# Patient Record
Sex: Female | Born: 1975 | Race: White | Hispanic: No | Marital: Married | Smoking: Never smoker
Health system: Southern US, Community
[De-identification: ages and names within clinical notes are randomized; demographics above are authoritative.]

## PROBLEM LIST (undated history)

## (undated) DIAGNOSIS — T7840XA Allergy, unspecified, initial encounter: Secondary | ICD-10-CM

## (undated) DIAGNOSIS — Z789 Other specified health status: Secondary | ICD-10-CM

## (undated) DIAGNOSIS — B019 Varicella without complication: Secondary | ICD-10-CM

## (undated) HISTORY — DX: Varicella without complication: B01.9

## (undated) HISTORY — DX: Other specified health status: Z78.9

## (undated) HISTORY — DX: Allergy, unspecified, initial encounter: T78.40XA

## (undated) HISTORY — PX: NO PAST SURGERIES: SHX2092

---

## 2007-06-13 ENCOUNTER — Ambulatory Visit: Payer: Self-pay | Admitting: Internal Medicine

## 2007-06-13 DIAGNOSIS — J309 Allergic rhinitis, unspecified: Secondary | ICD-10-CM | POA: Insufficient documentation

## 2009-02-27 ENCOUNTER — Telehealth: Payer: Self-pay | Admitting: *Deleted

## 2009-03-14 ENCOUNTER — Ambulatory Visit: Payer: Self-pay | Admitting: Internal Medicine

## 2009-03-26 ENCOUNTER — Inpatient Hospital Stay (HOSPITAL_COMMUNITY): Admission: AD | Admit: 2009-03-26 | Discharge: 2009-03-29 | Payer: Self-pay | Admitting: Obstetrics and Gynecology

## 2010-06-18 ENCOUNTER — Encounter: Payer: Self-pay | Admitting: Internal Medicine

## 2010-06-23 ENCOUNTER — Ambulatory Visit (INDEPENDENT_AMBULATORY_CARE_PROVIDER_SITE_OTHER): Payer: Managed Care, Other (non HMO) | Admitting: Internal Medicine

## 2010-06-23 ENCOUNTER — Encounter: Payer: Self-pay | Admitting: Internal Medicine

## 2010-06-23 VITALS — BP 120/80 | HR 60 | Wt 136.0 lb

## 2010-06-23 DIAGNOSIS — J309 Allergic rhinitis, unspecified: Secondary | ICD-10-CM

## 2010-06-23 DIAGNOSIS — R040 Epistaxis: Secondary | ICD-10-CM

## 2010-06-23 MED ORDER — FLUTICASONE PROPIONATE 50 MCG/ACT NA SUSP: 2.0000 | Freq: Every day | NASAL | Status: DC

## 2010-06-23 MED ORDER — MOMETASONE FUROATE 50 MCG/ACT NA SUSP: 2.0000 | Freq: Every day | NASAL | Status: DC

## 2010-06-23 NOTE — Patient Instructions (Signed)
Try the nasonex 2 spray each nostril each day and use saline to keep moist  Allegra claritin or  Zyrtec for antihistamine \  If not controlled call for reevaluation

## 2010-06-27 NOTE — Progress Notes (Signed)
  Subjective:    Patient ID: Becky Hoover, female    DOB: May 19, 1975, 35 y.o.   MRN: [id number]  HPI Patient comes in today for follow-up for medications on her allergies.  Her last office visit was over a year ago. She has use Flonase in the past with some significant help however she does get some nosebleeds or irritation with that. She also uses over-the-counter antihistamines. There is no history of asthma or shortness of breath. Her current treatment is Claritin and Flonase. Since her last visit with that she has had no major changes in her health status. Previous electronic record reviewed and updated.   Review of Systems No chest pain shortness of breath cough wheeze has nasal congestion is itching sneezing     Objective:   Physical Exam Well developed wn in acute distress HEENT: Normocephalic ;atraumatic , Eyes;  PERRL, EOMs  Full, lids and conjunctiva clear,,Ears: no deformities, canals nl, TM landmarks normal, Nose: no deformity or discharge Moderately congested turbinates faced nontender  Mouth : OP clear without lesion or edema . Neck : No masses adenopathy Chest:  Clear to A&P without wheezes rales or rhonchi CV:  S1-S2 no gallops or murmurs peripheral perfusion is normal Skin clear  No clubbing cyanosis or edema       Assessment & Plan:  Allergic rhinitis help with nasal steroids but side effects Sample of Nasonex given to see if this works better with less irritation. Instructions on use. She can call for refills as needed and see her in a year. She gets a regular checkups from her OB/GYN otherwise.

## 2010-07-13 LAB — CBC
HCT: 35.7 % — ABNORMAL LOW (ref 36.0–46.0)
HCT: 43.5 % (ref 36.0–46.0)
Hemoglobin: 14.7 g/dL (ref 12.0–15.0)
MCV: 94.5 fL (ref 78.0–100.0)
Platelets: 138 10*3/uL — ABNORMAL LOW (ref 150–400)
Platelets: 164 10*3/uL (ref 150–400)
RBC: 4.6 MIL/uL (ref 3.87–5.11)
RDW: 13 % (ref 11.5–15.5)
WBC: 10.7 10*3/uL — ABNORMAL HIGH (ref 4.0–10.5)
WBC: 16.8 10*3/uL — ABNORMAL HIGH (ref 4.0–10.5)

## 2010-07-13 LAB — CCBB MATERNAL DONOR DRAW

## 2010-10-28 ENCOUNTER — Other Ambulatory Visit: Payer: Self-pay | Admitting: Obstetrics and Gynecology

## 2010-10-28 DIAGNOSIS — Z1231 Encounter for screening mammogram for malignant neoplasm of breast: Secondary | ICD-10-CM

## 2010-11-25 ENCOUNTER — Ambulatory Visit
Admission: RE | Admit: 2010-11-25 | Discharge: 2010-11-25 | Disposition: A | Discharge status: Home / Self Care | Payer: Managed Care, Other (non HMO) | Source: Ambulatory Visit | Attending: Obstetrics and Gynecology | Admitting: Obstetrics and Gynecology

## 2010-11-25 DIAGNOSIS — Z1231 Encounter for screening mammogram for malignant neoplasm of breast: Secondary | ICD-10-CM

## 2011-02-05 LAB — OB RESULTS CONSOLE HEPATITIS B SURFACE ANTIGEN: Hepatitis B Surface Ag: NEGATIVE

## 2011-02-05 LAB — OB RESULTS CONSOLE GC/CHLAMYDIA: Gonorrhea: NEGATIVE

## 2011-02-05 LAB — OB RESULTS CONSOLE ANTIBODY SCREEN: Antibody Screen: NEGATIVE

## 2011-02-05 LAB — OB RESULTS CONSOLE RPR: RPR: NONREACTIVE

## 2011-04-13 NOTE — L&D Delivery Note (Signed)
Delivery Note At 4:51 PM a viable female was delivered via Vaginal, Spontaneous Delivery (Presentation: Left Occiput Anterior).  APGAR: 9, 9; weight P .   Placenta status: Intact, Spontaneous.  Cord: 3 vessels with the following complications: None.   Anesthesia: Epidural  Episiotomy: None Lacerations: 2nd degree Suture Repair: 3.0 vicryl rapide Est. Blood Loss (mL): 500  Mom to postpartum.  Baby to nursery-stable.  BOVARD,Lucindia Lemley 09/01/2011, 5:37 PM  Br/O+/ IUD

## 2011-08-31 ENCOUNTER — Telehealth (HOSPITAL_COMMUNITY): Payer: Self-pay | Admitting: *Deleted

## 2011-08-31 ENCOUNTER — Encounter (HOSPITAL_COMMUNITY): Payer: Self-pay | Admitting: *Deleted

## 2011-08-31 DIAGNOSIS — Z3480 Encounter for supervision of other normal pregnancy, unspecified trimester: Secondary | ICD-10-CM

## 2011-08-31 NOTE — Telephone Encounter (Signed)
Preadmission screen  

## 2011-08-31 NOTE — H&P (Signed)
Becky Hoover is a 36 y.o. female G2P1001 @ 39+ for iol given term status and favorable cervix.  Uncomplicated PNC, except + GBBS.  +FM, no LOF, no VB, occ ctx. Maternal Medical History:  Fetal activity: Perceived fetal activity is normal.      OB History    Grav Para Term Preterm Abortions TAB SAB Ect Mult Living   3 1 1  1  1   1     G1 term SVD, 7#4, female, G2 SAB, G3 present, no abn pap, no STDs Past Medical History  Diagnosis Date  . Allergy   . Varicella   . No pertinent past medical history    Past Surgical History  Procedure Date  . No past surgeries    Family History: family history includes Alcohol abuse in her father; Breast cancer in her mother; Cancer in her maternal aunt, mother, and paternal grandmother; Heart disease in her father and paternal grandfather; and Hypertension in her maternal grandmother. Social History:  reports that she has never smoked. She has never used smokeless tobacco. She reports that she drinks alcohol. She reports that she does not use illicit drugs.married, HR Meds PNV All NKDA   Review of Systems  Constitutional: Negative.   HENT: Negative.   Eyes: Negative.   Respiratory: Negative.   Cardiovascular: Negative.   Gastrointestinal: Negative.   Genitourinary: Negative.   Musculoskeletal: Negative.   Neurological: Negative.   Psychiatric/Behavioral: Negative.       Last menstrual period 11/28/2010. Maternal Exam:  Abdomen: Fundal height is appropriate for gestation.   Estimated fetal weight is 7-8#.   Fetal presentation: vertex  Pelvis: adequate for delivery.      Physical Exam  Constitutional: She is oriented to person, place, and time. She appears well-developed and well-nourished.  HENT:  Head: Normocephalic and atraumatic.  Neck: Normal range of motion. Neck supple.  Cardiovascular: Normal rate and regular rhythm.   Respiratory: Effort normal and breath sounds normal.  GI: Soft. Bowel sounds are normal. There is no  tenderness.  Musculoskeletal: Normal range of motion.  Neurological: She is alert and oriented to person, place, and time.  Skin: Skin is warm and dry.  Psychiatric: She has a normal mood and affect. Her behavior is normal.   SVE 3/50/-2 Prenatal labs: ABO, Rh: O/Positive/-- (10/26 0000) Antibody: Negative (10/26 0000) Rubella: Immune (10/26 0000) RPR: Nonreactive (10/26 0000)  HBsAg: Negative (10/26 0000)  HIV: Non-reactive (10/26 0000)  GBS: Positive (04/29 0000)  Hgb 14.0/ Pap WNL/ Plt 208K/ GC neg/Chl neg/ First Tri and AFP WNL/ CF neg/ glucola 130  First Tri [place] cwd Nl anat, Assessment/Plan: 35yo G3P1011 at 39+ for iol given term status and favorable cervix gbbs + treat with PCN, AROM after Pitocin to induce Epidural and/or IV pain meds for pain control   BOVARD,Mart Colpitts 08/31/2011, 4:32 PM

## 2011-09-01 ENCOUNTER — Encounter (HOSPITAL_COMMUNITY): Payer: Self-pay

## 2011-09-01 ENCOUNTER — Inpatient Hospital Stay (HOSPITAL_COMMUNITY)
Admission: RE | Admit: 2011-09-01 | Discharge: 2011-09-03 | DRG: 775 | Disposition: A | Discharge status: Home / Self Care | Payer: 59 | Source: Ambulatory Visit | Attending: Obstetrics and Gynecology | Admitting: Obstetrics and Gynecology

## 2011-09-01 ENCOUNTER — Inpatient Hospital Stay (HOSPITAL_COMMUNITY): Payer: 59 | Admitting: Anesthesiology

## 2011-09-01 ENCOUNTER — Encounter (HOSPITAL_COMMUNITY): Payer: Self-pay | Admitting: Anesthesiology

## 2011-09-01 VITALS — BP 103/63 | HR 80 | Temp 98.4°F | Resp 18 | Ht 65.0 in | Wt 165.0 lb

## 2011-09-01 DIAGNOSIS — Z22330 Carrier of Group B streptococcus: Secondary | ICD-10-CM

## 2011-09-01 DIAGNOSIS — Z3480 Encounter for supervision of other normal pregnancy, unspecified trimester: Secondary | ICD-10-CM

## 2011-09-01 DIAGNOSIS — O09529 Supervision of elderly multigravida, unspecified trimester: Secondary | ICD-10-CM | POA: Diagnosis present

## 2011-09-01 DIAGNOSIS — O99892 Other specified diseases and conditions complicating childbirth: Principal | ICD-10-CM | POA: Diagnosis present

## 2011-09-01 LAB — CBC
HCT: 38 % (ref 36.0–46.0)
Hemoglobin: 12.9 g/dL (ref 12.0–15.0)
MCHC: 33.9 g/dL (ref 30.0–36.0)
RBC: 4.08 MIL/uL (ref 3.87–5.11)
WBC: 10.6 10*3/uL — ABNORMAL HIGH (ref 4.0–10.5)

## 2011-09-01 MED ORDER — LACTATED RINGERS IV SOLN
INTRAVENOUS | Status: DC
  Administered 2011-09-01 (×2): 125 mL/h via INTRAVENOUS

## 2011-09-01 MED ORDER — CITRIC ACID-SODIUM CITRATE 334-500 MG/5ML PO SOLN: 30.0000 mL | ORAL | Status: DC | PRN

## 2011-09-01 MED ORDER — OXYTOCIN 20 UNITS IN LACTATED RINGERS INFUSION - SIMPLE
1.0000 m[IU]/min | INTRAVENOUS | Status: DC
  Administered 2011-09-01: 2 m[IU]/min via INTRAVENOUS
  Filled 2011-09-01: qty 1000

## 2011-09-01 MED ORDER — PRENATAL MULTIVITAMIN CH: 1.0000 | ORAL_TABLET | Freq: Every day | ORAL | Status: DC

## 2011-09-01 MED ORDER — IBUPROFEN 600 MG PO TABS: 600.0000 mg | ORAL_TABLET | Freq: Four times a day (QID) | ORAL | Status: DC | PRN

## 2011-09-01 MED ORDER — BUTORPHANOL TARTRATE 2 MG/ML IJ SOLN: 2.0000 mg | INTRAMUSCULAR | Status: DC | PRN

## 2011-09-01 MED ORDER — FENTANYL 2.5 MCG/ML BUPIVACAINE 1/10 % EPIDURAL INFUSION (WH - ANES)
INTRAMUSCULAR | Status: DC | PRN
  Administered 2011-09-01: 14 mL/h via EPIDURAL

## 2011-09-01 MED ORDER — SENNOSIDES-DOCUSATE SODIUM 8.6-50 MG PO TABS
2.0000 | ORAL_TABLET | Freq: Every day | ORAL | Status: DC
  Administered 2011-09-01 – 2011-09-02 (×2): 2 via ORAL

## 2011-09-01 MED ORDER — OXYCODONE-ACETAMINOPHEN 5-325 MG PO TABS: 1.0000 | ORAL_TABLET | ORAL | Status: DC | PRN

## 2011-09-01 MED ORDER — BENZOCAINE-MENTHOL 20-0.5 % EX AERO
1.0000 "application " | INHALATION_SPRAY | CUTANEOUS | Status: DC | PRN
  Filled 2011-09-01: qty 56

## 2011-09-01 MED ORDER — LANOLIN HYDROUS EX OINT: TOPICAL_OINTMENT | CUTANEOUS | Status: DC | PRN

## 2011-09-01 MED ORDER — PHENYLEPHRINE 40 MCG/ML (10ML) SYRINGE FOR IV PUSH (FOR BLOOD PRESSURE SUPPORT)
80.0000 ug | PREFILLED_SYRINGE | INTRAVENOUS | Status: DC | PRN
  Filled 2011-09-01: qty 2

## 2011-09-01 MED ORDER — WITCH HAZEL-GLYCERIN EX PADS: 1.0000 "application " | MEDICATED_PAD | CUTANEOUS | Status: DC | PRN

## 2011-09-01 MED ORDER — PHENYLEPHRINE 40 MCG/ML (10ML) SYRINGE FOR IV PUSH (FOR BLOOD PRESSURE SUPPORT)
80.0000 ug | PREFILLED_SYRINGE | INTRAVENOUS | Status: DC | PRN
  Filled 2011-09-01: qty 2
  Filled 2011-09-01: qty 5

## 2011-09-01 MED ORDER — OXYTOCIN BOLUS FROM INFUSION
500.0000 mL | Freq: Once | INTRAVENOUS | Status: DC
  Filled 2011-09-01: qty 500

## 2011-09-01 MED ORDER — PENICILLIN G POTASSIUM 5000000 UNITS IJ SOLR
2.5000 10*6.[IU] | INTRAVENOUS | Status: DC
  Administered 2011-09-01 (×2): 2.5 10*6.[IU] via INTRAVENOUS
  Filled 2011-09-01 (×5): qty 2.5

## 2011-09-01 MED ORDER — ONDANSETRON HCL 4 MG/2ML IJ SOLN: 4.0000 mg | Freq: Four times a day (QID) | INTRAMUSCULAR | Status: DC | PRN

## 2011-09-01 MED ORDER — SIMETHICONE 80 MG PO CHEW: 80.0000 mg | CHEWABLE_TABLET | ORAL | Status: DC | PRN

## 2011-09-01 MED ORDER — DIPHENHYDRAMINE HCL 25 MG PO CAPS: 25.0000 mg | ORAL_CAPSULE | Freq: Four times a day (QID) | ORAL | Status: DC | PRN

## 2011-09-01 MED ORDER — EPHEDRINE 5 MG/ML INJ
10.0000 mg | INTRAVENOUS | Status: DC | PRN
  Filled 2011-09-01: qty 2

## 2011-09-01 MED ORDER — LACTATED RINGERS IV SOLN: 500.0000 mL | INTRAVENOUS | Status: DC | PRN

## 2011-09-01 MED ORDER — FLEET ENEMA 7-19 GM/118ML RE ENEM: 1.0000 | ENEMA | RECTAL | Status: DC | PRN

## 2011-09-01 MED ORDER — FLUTICASONE PROPIONATE 50 MCG/ACT NA SUSP
2.0000 | Freq: Every day | NASAL | Status: DC
  Filled 2011-09-01: qty 16

## 2011-09-01 MED ORDER — FENTANYL 2.5 MCG/ML BUPIVACAINE 1/10 % EPIDURAL INFUSION (WH - ANES)
14.0000 mL/h | INTRAMUSCULAR | Status: DC
  Filled 2011-09-01: qty 60

## 2011-09-01 MED ORDER — ACETAMINOPHEN 325 MG PO TABS: 650.0000 mg | ORAL_TABLET | ORAL | Status: DC | PRN

## 2011-09-01 MED ORDER — LIDOCAINE HCL (PF) 1 % IJ SOLN
30.0000 mL | INTRAMUSCULAR | Status: DC | PRN
  Filled 2011-09-01 (×2): qty 30

## 2011-09-01 MED ORDER — ONDANSETRON HCL 4 MG/2ML IJ SOLN: 4.0000 mg | INTRAMUSCULAR | Status: DC | PRN

## 2011-09-01 MED ORDER — IBUPROFEN 600 MG PO TABS
600.0000 mg | ORAL_TABLET | Freq: Four times a day (QID) | ORAL | Status: DC
  Administered 2011-09-01 – 2011-09-03 (×6): 600 mg via ORAL
  Filled 2011-09-01 (×6): qty 1

## 2011-09-01 MED ORDER — DIBUCAINE 1 % RE OINT: 1.0000 "application " | TOPICAL_OINTMENT | RECTAL | Status: DC | PRN

## 2011-09-01 MED ORDER — TETANUS-DIPHTH-ACELL PERTUSSIS 5-2.5-18.5 LF-MCG/0.5 IM SUSP
0.5000 mL | Freq: Once | INTRAMUSCULAR | Status: AC
  Administered 2011-09-02: 0.5 mL via INTRAMUSCULAR
  Filled 2011-09-01: qty 0.5

## 2011-09-01 MED ORDER — LACTATED RINGERS IV SOLN
500.0000 mL | Freq: Once | INTRAVENOUS | Status: AC
  Administered 2011-09-01: 500 mL via INTRAVENOUS

## 2011-09-01 MED ORDER — LACTATED RINGERS IV SOLN: INTRAVENOUS | Status: DC

## 2011-09-01 MED ORDER — ZOLPIDEM TARTRATE 5 MG PO TABS: 5.0000 mg | ORAL_TABLET | Freq: Every evening | ORAL | Status: DC | PRN

## 2011-09-01 MED ORDER — EPHEDRINE 5 MG/ML INJ
10.0000 mg | INTRAVENOUS | Status: DC | PRN
  Filled 2011-09-01: qty 4
  Filled 2011-09-01: qty 2

## 2011-09-01 MED ORDER — TERBUTALINE SULFATE 1 MG/ML IJ SOLN: 0.2500 mg | Freq: Once | INTRAMUSCULAR | Status: DC | PRN

## 2011-09-01 MED ORDER — OXYTOCIN 20 UNITS IN LACTATED RINGERS INFUSION - SIMPLE
125.0000 mL/h | Freq: Once | INTRAVENOUS | Status: AC
  Administered 2011-09-01: 125 mL/h via INTRAVENOUS

## 2011-09-01 MED ORDER — PENICILLIN G POTASSIUM 5000000 UNITS IJ SOLR
5.0000 10*6.[IU] | Freq: Once | INTRAMUSCULAR | Status: AC
  Administered 2011-09-01: 5 10*6.[IU] via INTRAVENOUS
  Filled 2011-09-01: qty 5

## 2011-09-01 MED ORDER — LIDOCAINE HCL (PF) 1 % IJ SOLN
INTRAMUSCULAR | Status: DC | PRN
  Administered 2011-09-01 (×2): 5 mL

## 2011-09-01 MED ORDER — ONDANSETRON HCL 4 MG PO TABS: 4.0000 mg | ORAL_TABLET | ORAL | Status: DC | PRN

## 2011-09-01 MED ORDER — DIPHENHYDRAMINE HCL 50 MG/ML IJ SOLN
12.5000 mg | INTRAMUSCULAR | Status: DC | PRN
Start: 2011-09-01 — End: 2011-09-01

## 2011-09-01 NOTE — Progress Notes (Signed)
Becky Hoover is a 36 y.o. G3P1011 at [redacted]w[redacted]d by LMP admitted for induction of labor due to Elective at term.  Subjective: Comfortable with epidural  Objective: BP 114/67  Pulse 66  Temp(Src) 98.4 F (36.9 C) (Oral)  Resp 20  Ht 5\' 5"  (1.651 m)  Wt 74.844 kg (165 lb)  BMI 27.46 kg/m2  LMP 11/28/2010      FHT:  FHR: 120's bpm, variability: moderate,  accelerations:  Present,  decelerations:  Absent UC:   regular, every 2 minutes SVE:   Dilation: 5 Effacement (%): 90 Station: 0 Exam by:: dr. Ellyn Hack  AROM for clear fluid w/o diff/complication.  Labs: Lab Results  Component Value Date   WBC 10.6* 09/01/2011   HGB 12.9 09/01/2011   HCT 38.0 09/01/2011   MCV 93.1 09/01/2011   PLT 174 09/01/2011    Assessment / Plan: Induction of labor due to term with favorable cervix,  progressing well on pitocin  Labor: Progressing normally Preeclampsia:  no signs or symptoms of toxicity Fetal Wellbeing:  Category I Pain Control:  Epidural I/D:  n/a Anticipated MOD:  NSVD  BOVARD,Calum Cormier 09/01/2011, 2:44 PM

## 2011-09-01 NOTE — Anesthesia Preprocedure Evaluation (Signed)

## 2011-09-01 NOTE — Progress Notes (Signed)
Patient ID: Becky Hoover, female   DOB: 09-13-1975, 36 y.o.   MRN: [id number] Pt comfortable, H&P reviewed, no changes.  D/w pt POC.  Pitocin an dPCN will AROM at lunch. FHTs 120's category I tocoq 2-57min

## 2011-09-01 NOTE — Progress Notes (Signed)
Branna Cortina is a 36 y.o. G3P1011 at [redacted]w[redacted]d admitted for induction of labor due to Elective at term.  Subjective: Getting epidural  Objective: BP 129/78  Pulse 63  Temp(Src) 98.4 F (36.9 C) (Oral)  Resp 20  Ht 5\' 5"  (1.651 m)  Wt 74.844 kg (165 lb)  BMI 27.46 kg/m2  LMP 11/28/2010      FHT:  FHR: 120 bpm, variability: moderate,  accelerations:  Present,  decelerations:  Absent UC:   regular, every 2-4 minutes SVE:   Dilation: 4 Effacement (%): 70 Station: -1 Exam by:: dr. Ellyn Hack  Labs: Lab Results  Component Value Date   WBC 10.6* 09/01/2011   HGB 12.9 09/01/2011   HCT 38.0 09/01/2011   MCV 93.1 09/01/2011   PLT 174 09/01/2011    Assessment / Plan: Induction of labor due to term with favorable cervix,  progressing well on pitocin  Labor: Progressing normally Preeclampsia:  no signs or symptoms of toxicity Fetal Wellbeing:  Category I Pain Control:  Epidural I/D:  n/a Anticipated MOD:  NSVD  AROM after epidural placement  BOVARD,Marlita Keil 09/01/2011, 12:57 PM

## 2011-09-01 NOTE — Anesthesia Procedure Notes (Signed)
Epidural Patient location during procedure: OB Start time: 09/01/2011 1:20 PM  Staffing Anesthesiologist: Brayton Caves R Performed by: anesthesiologist   Preanesthetic Checklist Completed: patient identified, site marked, surgical consent, pre-op evaluation, timeout performed, IV checked, risks and benefits discussed and monitors and equipment checked  Epidural Patient position: sitting Prep: site prepped and draped and DuraPrep Patient monitoring: continuous pulse ox and blood pressure Approach: midline Injection technique: LOR air and LOR saline  Needle:  Needle type: Tuohy  Needle gauge: 17 G Needle length: 9 cm Needle insertion depth: 5 cm cm Catheter type: closed end flexible Catheter size: 19 Gauge Catheter at skin depth: 10 cm Test dose: negative  Assessment Events: blood not aspirated, injection not painful, no injection resistance, negative IV test and no paresthesia  Additional Notes Patient identified.  Risk benefits discussed including failed block, incomplete pain control, headache, nerve damage, paralysis, blood pressure changes, nausea, vomiting, reactions to medication both toxic or allergic, and postpartum back pain.  Patient expressed understanding and wished to proceed.  All questions were answered.  Sterile technique used throughout procedure and epidural site dressed with sterile barrier dressing. No paresthesia or other complications noted.The patient did not experience any signs of intravascular injection such as tinnitus or metallic taste in mouth nor signs of intrathecal spread such as rapid motor block. Please see nursing notes for vital signs.

## 2011-09-02 ENCOUNTER — Encounter (HOSPITAL_COMMUNITY): Payer: Self-pay

## 2011-09-02 LAB — CBC
Hemoglobin: 11.8 g/dL — ABNORMAL LOW (ref 12.0–15.0)
MCH: 30.8 pg (ref 26.0–34.0)
MCV: 93.5 fL (ref 78.0–100.0)
Platelets: 156 10*3/uL (ref 150–400)
RBC: 3.83 MIL/uL — ABNORMAL LOW (ref 3.87–5.11)

## 2011-09-02 NOTE — Progress Notes (Signed)
Post Partum Day 1 Subjective: no complaints, up ad lib, tolerating PO and nl lochia, pain controlled.  Some cramping  Objective: Blood pressure 106/57, pulse 71, temperature 98.5 F (36.9 C), temperature source Oral, resp. rate 18, height 5\' 5"  (1.651 m), weight 74.844 kg (165 lb), last menstrual period 11/28/2010, SpO2 99.00%, unknown if currently breastfeeding.  Physical Exam:  General: alert and no distress Lochia: appropriate Uterine Fundus: firm   Basename 09/02/11 0525 09/01/11 0750  HGB 11.8* 12.9  HCT 35.8* 38.0    Assessment/Plan: Plan for discharge tomorrow, Breastfeeding and Lactation consult  routine care   LOS: 1 day   BOVARD,Bethanny Toelle 09/02/2011, 7:56 AM

## 2011-09-03 MED ORDER — IBUPROFEN 800 MG PO TABS: 800.0000 mg | ORAL_TABLET | Freq: Three times a day (TID) | ORAL | Status: AC | PRN

## 2011-09-03 MED ORDER — PRENATAL MULTIVITAMIN CH: 1.0000 | ORAL_TABLET | Freq: Every day | ORAL | Status: DC

## 2011-09-03 MED ORDER — OXYCODONE-ACETAMINOPHEN 5-325 MG PO TABS: 1.0000 | ORAL_TABLET | Freq: Four times a day (QID) | ORAL | Status: AC | PRN

## 2011-09-03 NOTE — Progress Notes (Signed)
Post Partum Day 2 Subjective: no complaints, up ad lib, tolerating PO and nl lochia, pain controlled  Objective: Blood pressure 103/63, pulse 80, temperature 98.4 F (36.9 C), temperature source Oral, resp. rate 18, height 5\' 5"  (1.651 m), weight 74.844 kg (165 lb), last menstrual period 11/28/2010, SpO2 99.00%, unknown if currently breastfeeding.  Physical Exam:  General: alert and no distress Lochia: appropriate Uterine Fundus: firm   Basename 09/02/11 0525 09/01/11 0750  HGB 11.8* 12.9  HCT 35.8* 38.0    Assessment/Plan: Discharge home and Breastfeedingd/c with motrin/percocet/pnv.  F/u 6 weeks   LOS: 2 days   BOVARD,Jarred Purtee 09/03/2011, 8:16 AM

## 2011-09-03 NOTE — Discharge Summary (Signed)
Obstetric Discharge Summary Reason for Admission: induction of labor Prenatal Procedures: none Intrapartum Procedures: spontaneous vaginal delivery Postpartum Procedures: none Complications-Operative and Postpartum: 2nd degree perineal laceration Hemoglobin  Date Value Range Status  09/02/2011 11.8* 12.0-15.0 (g/dL) Final     HCT  Date Value Range Status  09/02/2011 35.8* 36.0-46.0 (%) Final    Physical Exam:  General: alert and no distress Lochia: appropriate Uterine Fundus: firm   Discharge Diagnoses: Term Pregnancy-delivered  Discharge Information: Date: 09/03/2011 Activity: pelvic rest Diet: routine Medications: PNV, Ibuprofen and Percocet Condition: stable Instructions: refer to practice specific booklet Discharge to: home Follow-up Information    Follow up with BOVARD,Amalya Salmons, MD. Schedule an appointment as soon as possible for a visit in 6 weeks.   Contact information:   [address] [address] [place] [address] [phone]          Newborn Data: Live born female  Birth Weight: 7 lb 3.2 oz (3265 g) APGAR: 9, 9  Home with mother.  BOVARD,Michaela Shankel 09/03/2011, 9:02 AM

## 2011-09-03 NOTE — Anesthesia Postprocedure Evaluation (Signed)
Anesthesia Post Note  Patient: Musician  Procedure(s) Performed: * No procedures listed *  Anesthesia type: Epidural  Patient location: Mother/Baby  Post pain: Pain level controlled  Post assessment: Post-op Vital signs reviewed  Last Vitals: There were no vitals filed for this visit.  Post vital signs: Reviewed  Level of consciousness: awake  Complications: No apparent anesthesia complications

## 2011-09-04 ENCOUNTER — Inpatient Hospital Stay (HOSPITAL_COMMUNITY): Admission: AD | Admit: 2011-09-04 | Payer: Self-pay | Source: Ambulatory Visit | Admitting: Obstetrics and Gynecology

## 2012-01-04 ENCOUNTER — Telehealth: Payer: Self-pay | Admitting: *Deleted

## 2012-01-04 NOTE — Telephone Encounter (Signed)
Confirmed 02/24/12 genetic appt w/ pt.  Called Brandi at referring to make aware.  Took paperwork to Clydie Braun.

## 2012-02-24 ENCOUNTER — Ambulatory Visit (HOSPITAL_BASED_OUTPATIENT_CLINIC_OR_DEPARTMENT_OTHER): Payer: 59 | Admitting: Genetic Counselor

## 2012-02-24 ENCOUNTER — Other Ambulatory Visit: Payer: 59 | Admitting: Lab

## 2012-02-24 ENCOUNTER — Encounter: Payer: Self-pay | Admitting: Genetic Counselor

## 2012-02-24 DIAGNOSIS — Z803 Family history of malignant neoplasm of breast: Secondary | ICD-10-CM

## 2012-02-24 DIAGNOSIS — IMO0002 Reserved for concepts with insufficient information to code with codable children: Secondary | ICD-10-CM

## 2012-02-24 NOTE — Progress Notes (Signed)
Dr. Sherron Monday Stuckert requested a consultation for genetic counseling and risk assessment for Becky Hoover, a 36 y.o. female, for discussion of her family history of breast and prostate cancer. She presents to clinic today to discuss the possibility of a genetic predisposition to cancer, and to further clarify her risks, as well as her family members' risks for cancer.   HISTORY OF PRESENT ILLNESS: Becky Hoover is a 36 y.o. female with no personal history of cancer.    Past Medical History  Diagnosis Date  . Allergy   . Varicella   . No pertinent past medical history   . SVD (spontaneous vaginal delivery) 09/01/2011    Past Surgical History  Procedure Date  . No past surgeries     History  Substance Use Topics  . Smoking status: Never Smoker   . Smokeless tobacco: Never Used  . Alcohol Use: 1.2 oz/week    2 Glasses of wine per week     Comment: 1-2 per week    REPRODUCTIVE HISTORY AND PERSONAL RISK ASSESSMENT FACTORS: Menarche was at age 36.   Premenopausal Uterus Intact: Yes Ovaries Intact: Yes G3P2A1 , first live birth at age 38  She has not previously undergone treatment for infertility.   OCP use for 7 years   She has not used HRT in the past.    FAMILY HISTORY:  We obtained a detailed, 4-generation family history.  Significant diagnoses are listed below: Family History  Problem Relation Age of Onset  . Breast cancer Mother 32  . Alcohol abuse Father   . Heart disease Father   . Breast cancer Maternal Aunt 37    possibly triple negative  . Hypertension Maternal Grandmother   . Lung cancer Paternal Grandmother   . Heart disease Paternal Grandfather   . Prostate cancer Maternal Uncle 59  . Breast cancer Other     MGM's two sisters  . Breast cancer Other     MGM's mother (great grandmother)  The patient's mother was diagnosed with breast cancer at age 23.  She has a brother and sister, the brother had prostate cancer at age 61- or 9, and her sister  was diagnosed with possibly triple negative breast cancer at age 76.  The patient's maternal grandmother is alive at 70 and had never had cancer.  The patients grandmother has three sisters and a brother.  Two of her sisters were diagnosed with breast cancer, one at age 85 and the other at an unknown age.  The patient's great grandmother was also diagnsoed with breast cancer over the age of 53.  The patient's paternal grandmother was a non-smoker, and was diagnosed with lung cancer.  Patient's maternal ancestors are of [place] descent, and paternal ancestors are of [place] descent. There is no reported Ashkenazi Jewish ancestry. There is no  known consanguinity.  GENETIC COUNSELING RISK ASSESSMENT, DISCUSSION, AND SUGGESTED FOLLOW UP: We reviewed the natural history and genetic etiology of sporadic, familial and hereditary cancer syndromes.  About 5-10% of breast cancer is hereditary.  Of this, about 85% is the result of a BRCA1 or BRCA2 mutation.  We reviewed the red flags of hereditary cancer syndromes and the dominant inheritance patterns.  If the BRCA testing is negative, we discussed that we could be testing for the wrong gene.  We discussed gene panels, and that several cancer genes that are associated with different cancers can be tested at the same time.  We reviewed that individuals with cancer are the most  informative individuals to test as they are the ones most likely to have had the consequence of having a gene mutation.  The patient seemed to understand and will talk with her family.  IF they are unable or unwilling to pursue testing she will call back to schedule a blood draw.  The patient is eligible for having screening through the high risk clinic and possibly having breast MRI's if her insurance approves.  The patient's family history of breast and prostate cancer is suggestive of the following possible diagnosis: hereditary cancer syndrome  We discussed that identification of a  hereditary cancer syndrome may help her care providers tailor the patients medical management. If a mutation indicating a hereditary cancer syndrome is detected in this case, the [employer] recommendations would include increased cancer surveillance and possible prophylactic surgery. If a mutation is detected, the patient will be referred back to the referring provider and to any additional appropriate care providers to discuss the relevant options.   If a mutation is not found in the patient, cancer surveillance options would be discussed for the patient according to the appropriate standard National Comprehensive Cancer Network and American Cancer Society guidelines, with consideration of their personal and family history risk factors. In this case, the patient will be referred back to their care providers for discussions of management.   In order to estimate her chance of having a BRCA mutation, we used statistical models (Penn II and tyrer Cusik) and laboratory data that take into account her personal medical history, family history and ancestry.  Because each model is different, there can be a lot of variability in the risks they give.  Therefore, these numbers must be considered a rough range and not a precise risk of having a BRCA mutation.  These models estimate that she has approximately a 3.5-8% chance of having a mutation. Based on this assessment of her family and personal history, genetic testing is recommended.  Based on the patient's personal and family history, statistical models (tyrer cusik)  and literature data were used to estimate her risk of developing breast cancer. This estimates her lifetime risk of developing breast cancer to be approximately 35%. This estimation does not take into account any genetic testing results.   After considering the risks, benefits, and limitations, the patient decided to talk with her family and will call back if she wants to  pursue genetic testing.   Per the patient's request, we will contact her by telephone to discuss these results. A follow up genetic counseling visit will be scheduled if indicated.  The patient was seen for a total of 60 minutes, greater than 50% of which was spent face-to-face counseling.  This plan is being carried out per Dr. Jeanella Flattery recommendations.  This note will also be sent to the referring provider via the electronic medical record. The patient will be supplied with a summary of this genetic counseling discussion as well as educational information on the discussed hereditary cancer syndromes following the conclusion of their visit.   Patient was discussed with Dr. Drue Second.   _______________________________________________________________________ For Office Staff:  Number of people involved in session: 2 Was an Intern/ student involved with case: no

## 2012-03-13 ENCOUNTER — Encounter: Payer: Self-pay | Admitting: Genetic Counselor

## 2012-06-29 ENCOUNTER — Encounter: Payer: Self-pay | Admitting: Internal Medicine

## 2012-06-29 ENCOUNTER — Ambulatory Visit (INDEPENDENT_AMBULATORY_CARE_PROVIDER_SITE_OTHER): Payer: 59 | Admitting: Internal Medicine

## 2012-06-29 VITALS — BP 104/80 | HR 86 | Temp 98.8°F | Wt 127.0 lb

## 2012-06-29 DIAGNOSIS — L259 Unspecified contact dermatitis, unspecified cause: Secondary | ICD-10-CM

## 2012-06-29 MED ORDER — CEPHALEXIN 500 MG PO CAPS: 500.0000 mg | ORAL_CAPSULE | Freq: Two times a day (BID) | ORAL | Status: DC

## 2012-06-29 MED ORDER — NYSTATIN 100000 UNIT/GM EX POWD: Freq: Two times a day (BID) | CUTANEOUS | Status: DC

## 2012-06-29 MED ORDER — FLUCONAZOLE 150 MG PO TABS: 150.0000 mg | ORAL_TABLET | Freq: Once | ORAL | Status: DC

## 2012-06-29 NOTE — Progress Notes (Signed)
Chief Complaint  Patient presents with  . Rash    Has a rash on her buttocks.  First noticed it around Thanksgiving.  Used diaper rash cream and a rx prescribed by her gynecologist.    HPI: Patient comes in today for SDA for  new problem evaluation.  Ever since the fall or the birth of her child she's had some problem with irritation ranch in the area between her gluteal folds. However just gotten much worse and now it is sore and painful. Itches at times. She called her OB/GYN and they gave her a pill to take one time this was in February. It may have helped a little bit but if persistent is compact comes in today for evaluation. Has usedYeast infection cream  .   Monistat  Cream for 4 days .  May be helping a little.  No history of this problem ever before no vaginal problems unusual rashes. No fever abdominal pain  ROS: See pertinent positives and negatives per HPI.  Past Medical History  Diagnosis Date  . Allergy   . Varicella   . No pertinent past medical history   . SVD (spontaneous vaginal delivery) 09/01/2011    Family History  Problem Relation Age of Onset  . Breast cancer Mother 76  . Alcohol abuse Father   . Heart disease Father   . Breast cancer Maternal Aunt 37    possibly triple negative  . Hypertension Maternal Grandmother   . Lung cancer Paternal Grandmother   . Heart disease Paternal Grandfather   . Prostate cancer Maternal Uncle 59  . Breast cancer Other     MGM's two sisters  . Breast cancer Other     MGM's mother (great grandmother)    History   Social History  . Marital Status: Married    Spouse Name: N/A    Number of Children: N/A  . Years of Education: N/A   Social History Main Topics  . Smoking status: Never Smoker   . Smokeless tobacco: Never Used  . Alcohol Use: 1.2 oz/week    2 Glasses of wine per week     Comment: 1-2 per week  . Drug Use: No  . Sexually Active: Yes   Other Topics Concern  . None   Social History Narrative   Lung  Cancer PGM   Heart Disease PGF CAD at 77   Married   HH 2   Husband    No pets   Masters degree had worked in [employer]    Outpatient Encounter Prescriptions as of 06/29/2012  Medication Sig Dispense Refill  . cephALEXin (KEFLEX) 500 MG capsule Take 1 capsule (500 mg total) by mouth 2 (two) times daily.  14 capsule  0  . fluconazole (DIFLUCAN) 150 MG tablet Take 1 tablet (150 mg total) by mouth once. Repeat in 3 days  2 tablet  0  . nystatin (MYCOSTATIN) powder Apply topically 2 (two) times daily. To affected area  30 g  1  . [DISCONTINUED] Prenatal Vit-Fe Fumarate-FA (PRENATAL MULTIVITAMIN) TABS Take 1 tablet by mouth at bedtime.      . [DISCONTINUED] Prenatal Vit-Fe Fumarate-FA (PRENATAL MULTIVITAMIN) TABS Take 1 tablet by mouth at bedtime.  30 tablet  12   No facility-administered encounter medications on file as of 06/29/2012.    EXAM:  BP 104/80  Pulse 86  Temp(Src) 98.8 F (37.1 C) (Oral)  Wt 127 lb (57.607 kg)  BMI 21.13 kg/m2  SpO2 99%  Body mass index is  21.13 kg/(m^2).  GENERAL: vitals reviewed and listed above, alert, oriented, appears well hydrated and in no acute distress  HEENT: atraumatic, conjunctiva  clear, no obvious abnormalities on inspection of external nose and ears OP : no lesion edema or exudate   Abdomen soft without organomegaly. Skin very large area in the intragluteal folds over the perirectal perineum area with what looks like a satellite patch on the left side. This area is red to brown seems to have discrete edge some fissuring noted and tenderness but I'm not a lot of edema. There is no discharge. No scaling is noted PSYCH: pleasant and cooperative, no obvious depression or anxiety  ASSESSMENT AND PLAN:  Discussed the following assessment and plan:  Perianal  perineal dermatitis - with possible secondary infection   Localized bacterial skin infection And also significant area with fissuring possibly secondary bacterial infection primarily appears  to be intertriginous and possibly monilial. We'll treat aggressively for both;   keep dry expectant management to be improved in the next 7-14 days if not contact health care team. -Patient advised to return or notify health care team  if symptoms worsen or persist or new concerns arise.  Patient Instructions  This looks like a severe yeast-type infection with possibly bacteria involved.  Use the nystatin antifungal powder a few times a day  At the antibiotic and antifungal.  Gentle cleaning with something like burrows solution may be soothing.   Neta Mends. Ashaki Frosch M.D.

## 2012-06-29 NOTE — Patient Instructions (Signed)
This looks like a severe yeast-type infection with possibly bacteria involved.  Use the nystatin antifungal powder a few times a day  At the antibiotic and antifungal.  Gentle cleaning with something like burrows solution may be soothing.

## 2012-07-17 ENCOUNTER — Other Ambulatory Visit: Payer: Self-pay | Admitting: Internal Medicine

## 2012-09-14 ENCOUNTER — Telehealth: Payer: Self-pay | Admitting: Internal Medicine

## 2012-09-14 NOTE — Telephone Encounter (Signed)
Patient Information:  Caller Name: Thi  Phone: [phone]  Patient: Becky Hoover  Gender: Female  DOB: 1975/09/29  Age: 37 Years  PCP: Berniece Andreas (Family Practice)  Pregnant: No  Office Follow Up:  Does the office need to follow up with this patient?: No  Instructions For The Office: N/A   Symptoms  Reason For Call & Symptoms: Pt seenon 06/29/12 for perineal rash/dermatitis and treated.  The rash has improved but not resolved.   Skin is scaly and red.  Pt has intermittent itchiness and tenderness.  Pt using OTC Lotrimin Spray.     Reviewed Health History In EMR: Yes  Reviewed Medications In EMR: Yes  Reviewed Allergies In EMR: Yes  Reviewed Surgeries / Procedures: Yes  Date of Onset of Symptoms: 03/09/2012 OB / GYN:  LMP: 08/29/2012  Guideline(s) Used:  Rash or Redness - Localized  Jock Itch  Disposition Per Guideline:   See Today or Tomorrow in Office  Reason For Disposition Reached:   After 3 weeks on treatment and rash has not completely gone away  Advice Given:  Call Back If:   You become worse.  Expected Course:  The rash should clear up completely in 2-3 weeks.  Call Back If:   You become worse.  Patient Will Follow Care Advice:  YES  Appointment Scheduled:  09/15/2012 10:45:00 Appointment Scheduled Provider:  Kriste Basque [facility])

## 2012-09-15 ENCOUNTER — Telehealth: Payer: Self-pay

## 2012-09-15 ENCOUNTER — Ambulatory Visit (INDEPENDENT_AMBULATORY_CARE_PROVIDER_SITE_OTHER): Payer: 59 | Admitting: Family Medicine

## 2012-09-15 ENCOUNTER — Encounter: Payer: Self-pay | Admitting: Family Medicine

## 2012-09-15 VITALS — BP 100/70 | Temp 98.6°F | Wt 130.0 lb

## 2012-09-15 DIAGNOSIS — R21 Rash and other nonspecific skin eruption: Secondary | ICD-10-CM

## 2012-09-15 MED ORDER — NYSTATIN 100000 UNIT/GM EX POWD: Freq: Two times a day (BID) | CUTANEOUS | Status: DC

## 2012-09-15 MED ORDER — FLUCONAZOLE 150 MG PO TABS: 150.0000 mg | ORAL_TABLET | Freq: Once | ORAL | Status: DC

## 2012-09-15 NOTE — Patient Instructions (Signed)
-  diflucan for 1 week as instructed if nystatin powder does not heal rash in 1 week  -see dermatologist if persists or returns  -follow up with your doctor in next few months for physical

## 2012-09-15 NOTE — Telephone Encounter (Signed)
Pharmacist called and left a vm stating pt was missing an rx.    Spoke with Dr. Selena Batten and she states pt's rash did not look infected and no antibiotic was called in. Fluconazole and Nystatin powder were sent over.  Called and spoke with pt and advised per Dr. Elmyra Ricks recommendations.  Pt verbalized understanding.

## 2012-09-15 NOTE — Progress Notes (Signed)
Chief Complaint  Patient presents with  . Rash    HPI:  Acute visit for Rash on buttocks: -per review of notes tx by gyn and PCP over > 6 months with diflucan x1, and fungal creams and keflex which have helped at times -given diflucan, keflex 3, nystatin powder months ago - went away for the most part, but then returned a few weeks ago when stopped nystatin powder -itchy rash in gluteal folds -is very active and sweats a lot -denies: fevers, chills, malaise, weight loss, drainage from rash  ROS: See pertinent positives and negatives per HPI.  Past Medical History  Diagnosis Date  . Allergy   . Varicella   . No pertinent past medical history   . SVD (spontaneous vaginal delivery) 09/01/2011    Family History  Problem Relation Age of Onset  . Breast cancer Mother 56  . Alcohol abuse Father   . Heart disease Father   . Breast cancer Maternal Aunt 37    possibly triple negative  . Hypertension Maternal Grandmother   . Lung cancer Paternal Grandmother   . Heart disease Paternal Grandfather   . Prostate cancer Maternal Uncle 59  . Breast cancer Other     MGM's two sisters  . Breast cancer Other     MGM's mother (great grandmother)    History   Social History  . Marital Status: Married    Spouse Name: N/A    Number of Children: N/A  . Years of Education: N/A   Social History Main Topics  . Smoking status: Never Smoker   . Smokeless tobacco: Never Used  . Alcohol Use: 1.2 oz/week    2 Glasses of wine per week     Comment: 1-2 per week  . Drug Use: No  . Sexually Active: Yes   Other Topics Concern  . 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  GENERAL: vitals reviewed and listed above, alert, oriented, appears well hydrated and in no acute distress  HEENT: atraumatic, conjunttiva clear, no obvious abnormalities on inspection of external nose and ears  NECK: no obvious masses on inspection  SKIN: scally erythematous rash in gluteal folds/cleft, no pustules, induration or drainage  MS: moves all extremities without noticeable abnormality  PSYCH: pleasant and cooperative, no obvious depression or anxiety  ASSESSMENT AND PLAN:  Discussed the following assessment and plan:  Rash of perineum - Plan: fluconazole (DIFLUCAN) 150 MG tablet, nystatin (MYCOSTATIN) powder  -despite antibiotic and antifungal tx this rash has persisted and pt is frustrated, appear candidal in appearance without signs of secondary infection at this time, advised referral to derm if returns for biopsy to confirm dx - info provided -in the meantime 7 day course oral diflucan if nystatin powder not successful, keep clean and  dry - no harsh soaps or lotions -advised follow up with PCP in next few months for physical with labs Recommendations per orders an instructions, risks and use of medications and return precautions discussed. -Patient advised to return or notify a doctor immediately if symptoms worsen or persist or new concerns arise.  There are no Patient Instructions on file for this visit.   Kriste Basque R.

## 2013-01-10 ENCOUNTER — Other Ambulatory Visit: Payer: 59

## 2013-01-22 ENCOUNTER — Encounter: Payer: 59 | Admitting: Internal Medicine

## 2013-03-30 ENCOUNTER — Other Ambulatory Visit (INDEPENDENT_AMBULATORY_CARE_PROVIDER_SITE_OTHER): Payer: BC Managed Care – PPO

## 2013-03-30 DIAGNOSIS — Z0000 Encounter for general adult medical examination without abnormal findings: Secondary | ICD-10-CM

## 2013-03-30 LAB — CBC WITH DIFFERENTIAL/PLATELET
Basophils Absolute: 0 10*3/uL (ref 0.0–0.1)
HCT: 38.8 % (ref 36.0–46.0)
Lymphs Abs: 1.3 10*3/uL (ref 0.7–4.0)
MCV: 90.2 fl (ref 78.0–100.0)
Monocytes Absolute: 0.3 10*3/uL (ref 0.1–1.0)
Platelets: 188 10*3/uL (ref 150.0–400.0)
RDW: 12.6 % (ref 11.5–14.6)

## 2013-03-30 LAB — HEPATIC FUNCTION PANEL
AST: 19 U/L (ref 0–37)
Albumin: 4.3 g/dL (ref 3.5–5.2)
Alkaline Phosphatase: 48 U/L (ref 39–117)
Total Protein: 6.7 g/dL (ref 6.0–8.3)

## 2013-03-30 LAB — BASIC METABOLIC PANEL
Calcium: 8.8 mg/dL (ref 8.4–10.5)
GFR: 98.17 mL/min (ref >60.00)
Sodium: 139 mEq/L (ref 135–145)

## 2013-03-30 LAB — TSH: TSH: 1.78 u[IU]/mL (ref 0.35–5.50)

## 2013-03-30 LAB — LIPID PANEL
HDL: 57.7 mg/dL (ref >39.00)
Triglycerides: 59 mg/dL (ref 0.0–149.0)

## 2013-04-06 ENCOUNTER — Encounter: Payer: Self-pay | Admitting: Internal Medicine

## 2013-04-06 ENCOUNTER — Ambulatory Visit (INDEPENDENT_AMBULATORY_CARE_PROVIDER_SITE_OTHER): Payer: BC Managed Care – PPO | Admitting: Internal Medicine

## 2013-04-06 VITALS — BP 110/74 | Temp 98.8°F | Ht 64.75 in | Wt 133.0 lb

## 2013-04-06 DIAGNOSIS — Z0000 Encounter for general adult medical examination without abnormal findings: Secondary | ICD-10-CM

## 2013-04-06 DIAGNOSIS — Z23 Encounter for immunization: Secondary | ICD-10-CM

## 2013-04-06 NOTE — Patient Instructions (Signed)
Continue lifestyle intervention healthy eating and exercise .  Preventive Care for Adults, Female A healthy lifestyle and preventive care can promote health and wellness. Preventive health guidelines for women include the following key practices.  A routine yearly physical is a good way to check with your caregiver about your health and preventive screening. It is a chance to share any concerns and updates on your health, and to receive a thorough exam.  Visit your dentist for a routine exam and preventive care every 6 months. Brush your teeth twice a day and floss once a day. Good oral hygiene prevents tooth decay and gum disease.  The frequency of eye exams is based on your age, health, family medical history, use of contact lenses, and other factors. Follow your caregiver's recommendations for frequency of eye exams.  Eat a healthy diet. Foods like vegetables, fruits, whole grains, low-fat dairy products, and lean protein foods contain the nutrients you need without too many calories. Decrease your intake of foods high in solid fats, added sugars, and salt. Eat the right amount of calories for you.Get information about a proper diet from your caregiver, if necessary.  Regular physical exercise is one of the most important things you can do for your health. Most adults should get at least 150 minutes of moderate-intensity exercise (any activity that increases your heart rate and causes you to sweat) each week. In addition, most adults need muscle-strengthening exercises on 2 or more days a week.  Maintain a healthy weight. The body mass index (BMI) is a screening tool to identify possible weight problems. It provides an estimate of body fat based on height and weight. Your caregiver can help determine your BMI, and can help you achieve or maintain a healthy weight.For adults 20 years and older:  A BMI below 18.5 is considered underweight.  A BMI of 18.5 to 24.9 is normal.  A BMI of 25 to  29.9 is considered overweight.  A BMI of 30 and above is considered obese.  Maintain normal blood lipids and cholesterol levels by exercising and minimizing your intake of saturated fat. Eat a balanced diet with plenty of fruit and vegetables. Blood tests for lipids and cholesterol should begin at age 85 and be repeated every 5 years. If your lipid or cholesterol levels are high, you are over 50, or you are at high risk for heart disease, you may need your cholesterol levels checked more frequently.Ongoing high lipid and cholesterol levels should be treated with medicines if diet and exercise are not effective.  If you smoke, find out from your caregiver how to quit. If you do not use tobacco, do not start.  Lung cancer screening is recommended for adults aged 17 80 years who are at high risk for developing lung cancer because of a history of smoking. Yearly low-dose computed tomography (CT) is recommended for people who have at least a 30-pack-year history of smoking and are a current smoker or have quit within the past 15 years. A pack year of smoking is smoking an average of 1 pack of cigarettes a day for 1 year (for example: 1 pack a day for 30 years or 2 packs a day for 15 years). Yearly screening should continue until the smoker has stopped smoking for at least 15 years. Yearly screening should also be stopped for people who develop a health problem that would prevent them from having lung cancer treatment.  If you are pregnant, do not drink alcohol. If you are breastfeeding,  be very cautious about drinking alcohol. If you are not pregnant and choose to drink alcohol, do not exceed 1 drink per day. One drink is considered to be 12 ounces (355 mL) of beer, 5 ounces (148 mL) of wine, or 1.5 ounces (44 mL) of liquor.  Avoid use of street drugs. Do not share needles with anyone. Ask for help if you need support or instructions about stopping the use of drugs.  High blood pressure causes heart  disease and increases the risk of stroke. Your blood pressure should be checked at least every 1 to 2 years. Ongoing high blood pressure should be treated with medicines if weight loss and exercise are not effective.  If you are 55 to 37 years old, ask your caregiver if you should take aspirin to prevent strokes.  Diabetes screening involves taking a blood sample to check your fasting blood sugar level. This should be done once every 3 years, after age 25, if you are within normal weight and without risk factors for diabetes. Testing should be considered at a younger age or be carried out more frequently if you are overweight and have at least 1 risk factor for diabetes.  Breast cancer screening is essential preventive care for women. You should practice "breast self-awareness." This means understanding the normal appearance and feel of your breasts and may include breast self-examination. Any changes detected, no matter how small, should be reported to a caregiver. Women in their 77s and 30s should have a clinical breast exam (CBE) by a caregiver as part of a regular health exam every 1 to 3 years. After age 22, women should have a CBE every year. Starting at age 64, women should consider having a mammography (breast X-ray test) every year. Women who have a family history of breast cancer should talk to their caregiver about genetic screening. Women at a high risk of breast cancer should talk to their caregivers about having magnetic resonance imaging (MRI) and a mammography every year.  Breast cancer gene (BRCA)-related cancer risk assessment is recommended for women who have family members with BRCA-related cancers. BRCA-related cancers include breast, ovarian, tubal, and peritoneal cancers. Having family members with these cancers may be associated with an increased risk for harmful changes (mutations) in the breast cancer genes BRCA1 and BRCA2. Results of the assessment will determine the need for  genetic counseling and BRCA1 and BRCA2 testing.  The Pap test is a screening test for cervical cancer. A Pap test can show cell changes on the cervix that might become cervical cancer if left untreated. A Pap test is a procedure in which cells are obtained and examined from the lower end of the uterus (cervix).  Women should have a Pap test starting at age 51.  Between ages 45 and 18, Pap tests should be repeated every 2 years.  Beginning at age 74, you should have a Pap test every 3 years as long as the past 3 Pap tests have been normal.  Some women have medical problems that increase the chance of getting cervical cancer. Talk to your caregiver about these problems. It is especially important to talk to your caregiver if a new problem develops soon after your last Pap test. In these cases, your caregiver may recommend more frequent screening and Pap tests.  The above recommendations are the same for women who have or have not gotten the vaccine for human papillomavirus (HPV).  If you had a hysterectomy for a problem that was not cancer  or a condition that could lead to cancer, then you no longer need Pap tests. Even if you no longer need a Pap test, a regular exam is a good idea to make sure no other problems are starting.  If you are between ages 50 and 52, and you have had normal Pap tests going back 10 years, you no longer need Pap tests. Even if you no longer need a Pap test, a regular exam is a good idea to make sure no other problems are starting.  If you have had past treatment for cervical cancer or a condition that could lead to cancer, you need Pap tests and screening for cancer for at least 20 years after your treatment.  If Pap tests have been discontinued, risk factors (such as a new sexual partner) need to be reassessed to determine if screening should be resumed.  The HPV test is an additional test that may be used for cervical cancer screening. The HPV test looks for the virus  that can cause the cell changes on the cervix. The cells collected during the Pap test can be tested for HPV. The HPV test could be used to screen women aged 110 years and older, and should be used in women of any age who have unclear Pap test results. After the age of 78, women should have HPV testing at the same frequency as a Pap test.  Colorectal cancer can be detected and often prevented. Most routine colorectal cancer screening begins at the age of 60 and continues through age 17. However, your caregiver may recommend screening at an earlier age if you have risk factors for colon cancer. On a yearly basis, your caregiver may provide home test kits to check for hidden blood in the stool. Use of a small camera at the end of a tube, to directly examine the colon (sigmoidoscopy or colonoscopy), can detect the earliest forms of colorectal cancer. Talk to your caregiver about this at age 4, when routine screening begins. Direct examination of the colon should be repeated every 5 to 10 years through age 72, unless early forms of pre-cancerous polyps or small growths are found.  Hepatitis C blood testing is recommended for all people born from [birth year] through 1965 and any individual with known risks for hepatitis C.  Practice safe sex. Use condoms and avoid high-risk sexual practices to reduce the spread of sexually transmitted infections (STIs). STIs include gonorrhea, chlamydia, syphilis, trichomonas, herpes, HPV, and human immunodeficiency virus (HIV). Herpes, HIV, and HPV are viral illnesses that have no cure. They can result in disability, cancer, and death. Sexually active women aged 43 and younger should be checked for chlamydia. Older women with new or multiple partners should also be tested for chlamydia. Testing for other STIs is recommended if you are sexually active and at increased risk.  Osteoporosis is a disease in which the bones lose minerals and strength with aging. This can result in serious  bone fractures. The risk of osteoporosis can be identified using a bone density scan. Women ages 35 and over and women at risk for fractures or osteoporosis should discuss screening with their caregivers. Ask your caregiver whether you should take a calcium supplement or vitamin D to reduce the rate of osteoporosis.  Menopause can be associated with physical symptoms and risks. Hormone replacement therapy is available to decrease symptoms and risks. You should talk to your caregiver about whether hormone replacement therapy is right for you.  Use sunscreen. Apply sunscreen liberally  and repeatedly throughout the day. You should seek shade when your shadow is shorter than you. Protect yourself by wearing long sleeves, pants, a wide-brimmed hat, and sunglasses year round, whenever you are outdoors.  Once a month, do a whole body skin exam, using a mirror to look at the skin on your back. Notify your caregiver of new moles, moles that have irregular borders, moles that are larger than a pencil eraser, or moles that have changed in shape or color.  Stay current with required immunizations.  Influenza vaccine. All adults should be immunized every year.  Tetanus, diphtheria, and acellular pertussis (Td, Tdap) vaccine. Pregnant women should receive 1 dose of Tdap vaccine during each pregnancy. The dose should be obtained regardless of the length of time since the last dose. Immunization is preferred during the 27th to 36th week of gestation. An adult who has not previously received Tdap or who does not know her vaccine status should receive 1 dose of Tdap. This initial dose should be followed by tetanus and diphtheria toxoids (Td) booster doses every 10 years. Adults with an unknown or incomplete history of completing a 3-dose immunization series with Td-containing vaccines should begin or complete a primary immunization series including a Tdap dose. Adults should receive a Td booster every 10  years.  Varicella vaccine. An adult without evidence of immunity to varicella should receive 2 doses or a second dose if she has previously received 1 dose. Pregnant females who do not have evidence of immunity should receive the first dose after pregnancy. This first dose should be obtained before leaving the health care facility. The second dose should be obtained 4 8 weeks after the first dose.  Human papillomavirus (HPV) vaccine. Females aged 82 26 years who have not received the vaccine previously should obtain the 3-dose series. The vaccine is not recommended for use in pregnant females. However, pregnancy testing is not needed before receiving a dose. If a female is found to be pregnant after receiving a dose, no treatment is needed. In that case, the remaining doses should be delayed until after the pregnancy. Immunization is recommended for any person with an immunocompromised condition through the age of 26 years if she did not get any or all doses earlier. During the 3-dose series, the second dose should be obtained 4 8 weeks after the first dose. The third dose should be obtained 24 weeks after the first dose and 16 weeks after the second dose.  Zoster vaccine. One dose is recommended for adults aged 32 years or older unless certain conditions are present.  Measles, mumps, and rubella (MMR) vaccine. Adults born before [birth year] generally are considered immune to measles and mumps. Adults born in [birth year] or later should have 1 or more doses of MMR vaccine unless there is a contraindication to the vaccine or there is laboratory evidence of immunity to each of the three diseases. A routine second dose of MMR vaccine should be obtained at least 28 days after the first dose for students attending postsecondary schools, health care workers, or international travelers. People who received inactivated measles vaccine or an unknown type of measles vaccine during 1963 1967 should receive 2 doses of MMR vaccine.  People who received inactivated mumps vaccine or an unknown type of mumps vaccine before 1979 and are at high risk for mumps infection should consider immunization with 2 doses of MMR vaccine. For females of childbearing age, rubella immunity should be determined. If there is no evidence of immunity, females  who are not pregnant should be vaccinated. If there is no evidence of immunity, females who are pregnant should delay immunization until after pregnancy. Unvaccinated health care workers born before [birth year] who lack laboratory evidence of measles, mumps, or rubella immunity or laboratory confirmation of disease should consider measles and mumps immunization with 2 doses of MMR vaccine or rubella immunization with 1 dose of MMR vaccine.  Pneumococcal 13-valent conjugate (PCV13) vaccine. When indicated, a person who is uncertain of her immunization history and has no record of immunization should receive the PCV13 vaccine. An adult aged 75 years or older who has certain medical conditions and has not been previously immunized should receive 1 dose of PCV13 vaccine. This PCV13 should be followed with a dose of pneumococcal polysaccharide (PPSV23) vaccine. The PPSV23 vaccine dose should be obtained at least 8 weeks after the dose of PCV13 vaccine. An adult aged 87 years or older who has certain medical conditions and previously received 1 or more doses of PPSV23 vaccine should receive 1 dose of PCV13. The PCV13 vaccine dose should be obtained 1 or more years after the last PPSV23 vaccine dose.  Pneumococcal polysaccharide (PPSV23) vaccine. When PCV13 is also indicated, PCV13 should be obtained first. All adults aged 27 years and older should be immunized. An adult younger than age 5 years who has certain medical conditions should be immunized. Any person who resides in a nursing home or long-term care facility should be immunized. An adult smoker should be immunized. People with an immunocompromised condition and  certain other conditions should receive both PCV13 and PPSV23 vaccines. People with human immunodeficiency virus (HIV) infection should be immunized as soon as possible after diagnosis. Immunization during chemotherapy or radiation therapy should be avoided. Routine use of PPSV23 vaccine is not recommended for American Indians, [address], or people younger than 65 years unless there are medical conditions that require PPSV23 vaccine. When indicated, people who have unknown immunization and have no record of immunization should receive PPSV23 vaccine. One-time revaccination 5 years after the first dose of PPSV23 is recommended for people aged 24 64 years who have chronic kidney failure, nephrotic syndrome, asplenia, or immunocompromised conditions. People who received 1 2 doses of PPSV23 before age 11 years should receive another dose of PPSV23 vaccine at age 41 years or later if at least 5 years have passed since the previous dose. Doses of PPSV23 are not needed for people immunized with PPSV23 at or after age 49 years.  Meningococcal vaccine. Adults with asplenia or persistent complement component deficiencies should receive 2 doses of quadrivalent meningococcal conjugate (MenACWY-D) vaccine. The doses should be obtained at least 2 months apart. Microbiologists working with certain meningococcal bacteria, military recruits, people at risk during an outbreak, and people who travel to or live in countries with a high rate of meningitis should be immunized. A first-year college student up through age 35 years who is living in a residence hall should receive a dose if she did not receive a dose on or after her 16th birthday. Adults who have certain high-risk conditions should receive one or more doses of vaccine.  Hepatitis A vaccine. Adults who wish to be protected from this disease, have certain high-risk conditions, work with hepatitis A-infected animals, work in hepatitis A research labs, or travel to or  work in countries with a high rate of hepatitis A should be immunized. Adults who were previously unvaccinated and who anticipate close contact with an international adoptee during the first 60 days  after arrival in the [place] from a country with a high rate of hepatitis A should be immunized.  Hepatitis B vaccine. Adults who wish to be protected from this disease, have certain high-risk conditions, may be exposed to blood or other infectious body fluids, are household contacts or sex partners of hepatitis B positive people, are clients or workers in certain care facilities, or travel to or work in countries with a high rate of hepatitis B should be immunized.  Haemophilus influenzae type b (Hib) vaccine. A previously unvaccinated person with asplenia or sickle cell disease or having a scheduled splenectomy should receive 1 dose of Hib vaccine. Regardless of previous immunization, a recipient of a hematopoietic stem cell transplant should receive a 3-dose series 6 12 months after her successful transplant. Hib vaccine is not recommended for adults with HIV infection. Preventive Services / Frequency Ages 55 to 82  Blood pressure check.** / Every 1 to 2 years.  Lipid and cholesterol check.** / Every 5 years beginning at age 48.  Clinical breast exam.** / Every 3 years for women in their 20s and 30s.  BRCA-related cancer risk assessment.** / For women who have family members with a BRCA-related cancer (breast, ovarian, tubal, or peritoneal cancers).  Pap test.** / Every 2 years from ages 19 through 51. Every 3 years starting at age 56 through age 31 or 64 with a history of 3 consecutive normal Pap tests.  HPV screening.** / Every 3 years from ages 80 through ages 31 to 66 with a history of 3 consecutive normal Pap tests.  Hepatitis C blood test.** / For any individual with known risks for hepatitis C.  Skin self-exam. / Monthly.  Influenza vaccine. / Every year.  Tetanus, diphtheria,  and acellular pertussis (Tdap, Td) vaccine.** / Consult your caregiver. Pregnant women should receive 1 dose of Tdap vaccine during each pregnancy. 1 dose of Td every 10 years.  Varicella vaccine.** / Consult your caregiver. Pregnant females who do not have evidence of immunity should receive the first dose after pregnancy.  HPV vaccine. / 3 doses over 6 months, if 26 and younger. The vaccine is not recommended for use in pregnant females. However, pregnancy testing is not needed before receiving a dose.  Measles, mumps, rubella (MMR) vaccine.** / You need at least 1 dose of MMR if you were born in 1957 or later. You may also need a 2nd dose. For females of childbearing age, rubella immunity should be determined. If there is no evidence of immunity, females who are not pregnant should be vaccinated. If there is no evidence of immunity, females who are pregnant should delay immunization until after pregnancy.  Pneumococcal 13-valent conjugate (PCV13) vaccine.** / Consult your caregiver.  Pneumococcal polysaccharide (PPSV23) vaccine.** / 1 to 2 doses if you smoke cigarettes or if you have certain conditions.  Meningococcal vaccine.** / 1 dose if you are age 72 to 44 years and a Orthoptist living in a residence hall, or have one of several medical conditions, you need to get vaccinated against meningococcal disease. You may also need additional booster doses.  Hepatitis A vaccine.** / Consult your caregiver.  Hepatitis B vaccine.** / Consult your caregiver.  Haemophilus influenzae type b (Hib) vaccine.** / Consult your caregiver. Ages 51 to 2  Blood pressure check.** / Every 1 to 2 years.  Lipid and cholesterol check.** / Every 5 years beginning at age 33.  Lung cancer screening. / Every year if you are aged 38 80  years and have a 30-pack-year history of smoking and currently smoke or have quit within the past 15 years. Yearly screening is stopped once you have quit smoking for  at least 15 years or develop a health problem that would prevent you from having lung cancer treatment.  Clinical breast exam.** / Every year after age 67.  BRCA-related cancer risk assessment.** / For women who have family members with a BRCA-related cancer (breast, ovarian, tubal, or peritoneal cancers).  Mammogram.** / Every year beginning at age 78 and continuing for as long as you are in good health. Consult with your caregiver.  Pap test.** / Every 3 years starting at age 59 through age 49 or 11 with a history of 3 consecutive normal Pap tests.  HPV screening.** / Every 3 years from ages 33 through ages 69 to 86 with a history of 3 consecutive normal Pap tests.  Fecal occult blood test (FOBT) of stool. / Every year beginning at age 31 and continuing until age 97. You may not need to do this test if you get a colonoscopy every 10 years.  Flexible sigmoidoscopy or colonoscopy.** / Every 5 years for a flexible sigmoidoscopy or every 10 years for a colonoscopy beginning at age 39 and continuing until age 51.  Hepatitis C blood test.** / For all people born from [birth year] through 1965 and any individual with known risks for hepatitis C.  Skin self-exam. / Monthly.  Influenza vaccine. / Every year.  Tetanus, diphtheria, and acellular pertussis (Tdap/Td) vaccine.** / Consult your caregiver. Pregnant women should receive 1 dose of Tdap vaccine during each pregnancy. 1 dose of Td every 10 years.  Varicella vaccine.** / Consult your caregiver. Pregnant females who do not have evidence of immunity should receive the first dose after pregnancy.  Zoster vaccine.** / 1 dose for adults aged 45 years or older.  Measles, mumps, rubella (MMR) vaccine.** / You need at least 1 dose of MMR if you were born in 1957 or later. You may also need a 2nd dose. For females of childbearing age, rubella immunity should be determined. If there is no evidence of immunity, females who are not pregnant should be  vaccinated. If there is no evidence of immunity, females who are pregnant should delay immunization until after pregnancy.  Pneumococcal 13-valent conjugate (PCV13) vaccine.** / Consult your caregiver.  Pneumococcal polysaccharide (PPSV23) vaccine.** / 1 to 2 doses if you smoke cigarettes or if you have certain conditions.  Meningococcal vaccine.** / Consult your caregiver.  Hepatitis A vaccine.** / Consult your caregiver.  Hepatitis B vaccine.** / Consult your caregiver.  Haemophilus influenzae type b (Hib) vaccine.** / Consult your caregiver. Ages 79 and over  Blood pressure check.** / Every 1 to 2 years.  Lipid and cholesterol check.** / Every 5 years beginning at age 15.  Lung cancer screening. / Every year if you are aged 44 80 years and have a 30-pack-year history of smoking and currently smoke or have quit within the past 15 years. Yearly screening is stopped once you have quit smoking for at least 15 years or develop a health problem that would prevent you from having lung cancer treatment.  Clinical breast exam.** / Every year after age 72.  BRCA-related cancer risk assessment.** / For women who have family members with a BRCA-related cancer (breast, ovarian, tubal, or peritoneal cancers).  Mammogram.** / Every year beginning at age 22 and continuing for as long as you are in good health. Consult with your caregiver.  Pap test.** / Every 3 years starting at age 31 through age 35 or 60 with a 3 consecutive normal Pap tests. Testing can be stopped between 65 and 70 with 3 consecutive normal Pap tests and no abnormal Pap or HPV tests in the past 10 years.  HPV screening.** / Every 3 years from ages 78 through ages 39 or 8 with a history of 3 consecutive normal Pap tests. Testing can be stopped between 65 and 70 with 3 consecutive normal Pap tests and no abnormal Pap or HPV tests in the past 10 years.  Fecal occult blood test (FOBT) of stool. / Every year beginning at age 69 and  continuing until age 45. You may not need to do this test if you get a colonoscopy every 10 years.  Flexible sigmoidoscopy or colonoscopy.** / Every 5 years for a flexible sigmoidoscopy or every 10 years for a colonoscopy beginning at age 52 and continuing until age 31.  Hepatitis C blood test.** / For all people born from [birth year] through 1965 and any individual with known risks for hepatitis C.  Osteoporosis screening.** / A one-time screening for women ages 82 and over and women at risk for fractures or osteoporosis.  Skin self-exam. / Monthly.  Influenza vaccine. / Every year.  Tetanus, diphtheria, and acellular pertussis (Tdap/Td) vaccine.** / 1 dose of Td every 10 years.  Varicella vaccine.** / Consult your caregiver.  Zoster vaccine.** / 1 dose for adults aged 35 years or older.  Pneumococcal 13-valent conjugate (PCV13) vaccine.** / Consult your caregiver.  Pneumococcal polysaccharide (PPSV23) vaccine.** / 1 dose for all adults aged 27 years and older.  Meningococcal vaccine.** / Consult your caregiver.  Hepatitis A vaccine.** / Consult your caregiver.  Hepatitis B vaccine.** / Consult your caregiver.  Haemophilus influenzae type b (Hib) vaccine.** / Consult your caregiver. ** Family history and personal history of risk and conditions may change your caregiver's recommendations. Document Released: 05/25/2001 Document Revised: 07/24/2012 Document Reviewed: 08/24/2010 [facility] Patient Information 2014 [place], [place].

## 2013-04-06 NOTE — Progress Notes (Signed)
Chief Complaint  Patient presents with  . Annual Exam    HPI: Patient comes in today for Preventive Health Care visit  No major change in health status since last visit . Needs flu vaccine  Health Maintenance  Topic Date Due  . Pap Smear  05/13/2009  . Influenza Vaccine  11/10/2012  . 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 Moderate etoh.  Fam hx :  Mom HT.   ROS:  GEN/ HEENT: No fever, significant weight changes sweats headaches vision problems hearing changes, CV/ PULM; No chest pain shortness of breath cough, syncope,edema  change in exercise tolerance. GI /GU: No adominal pain, vomiting, change in bowel habits. No blood in the stool. No significant GU symptoms. SKIN/HEME: ,no acute skin rashes suspicious lesions or bleeding. No lymphadenopathy, nodules, masses.  NEURO/ PSYCH:  No neurologic signs such as weakness numbness. No depression anxiety. IMM/ Allergy: No unusual infections.  Allergy .   REST of 12 system review negative except as per HPI   Past Medical History  Diagnosis Date  . Allergy   . Varicella   . No pertinent past medical history   . SVD (spontaneous vaginal delivery) 09/01/2011    Family History  Problem Relation Age of Onset  . Breast cancer Mother 62  . Alcohol abuse Father   . Heart disease Father   . Breast cancer Maternal Aunt 37    possibly triple negative  . Hypertension Maternal Grandmother   . Lung cancer Paternal Grandmother   . Heart disease Paternal Grandfather   . Prostate cancer Maternal Uncle 59  . Breast cancer Other     MGM's two sisters  . Breast cancer Other     MGM's mother (great grandmother)    History   Social History  . Marital Status: Married    Spouse Name: N/A    Number of Children: N/A  . Years of Education: N/A   Social History Main Topics  . Smoking status: Never Smoker   .  Smokeless tobacco: Never Used  . Alcohol Use: 1.2 oz/week    2 Glasses of wine per week     Comment: 1-2 per week  . Drug Use: No  . Sexual Activity: Yes   Other Topics Concern  . 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     Outpatient Encounter Prescriptions as of 04/06/2013  Medication Sig  . levonorgestrel (MIRENA) 20 MCG/24HR IUD 1 each by Intrauterine route once.  . [DISCONTINUED] cephALEXin (KEFLEX) 500 MG capsule Take 1 capsule (500 mg total) by mouth 2 (two) times daily.  . [DISCONTINUED] fluconazole (DIFLUCAN) 150 MG tablet Take 1 tablet (150 mg total) by mouth once.  . [DISCONTINUED] nystatin (MYCOSTATIN) powder Apply topically 2 (two) times daily.    EXAM:  BP 110/74  Temp(Src) 98.8 F (37.1 C) (Oral)  Ht 5' 4.75" (1.645 m)  Wt 133 lb (60.328 kg)  BMI 22.29 kg/m2  Breastfeeding? No  Body mass index is 22.29 kg/(m^2).  Physical Exam: Vital signs reviewed [id number] is a well-developed well-nourished alert cooperative   female who appears her stated age in no acute distress.  HEENT: normocephalic atraumatic , Eyes: PERRL EOM's full, conjunctiva clear, Nares: paten,t no deformity discharge or tenderness., Ears: no deformity EAC's clear TMs with normal landmarks. Mouth: clear OP, no lesions, edema.  Moist mucous membranes. Dentition in adequate repair. NECK: supple without masses, thyromegaly or bruits. CHEST/PULM:  Clear to auscultation and percussion breath sounds equal no wheeze , rales or rhonchi. No chest wall deformities or tenderness. Breast: normal by inspection . No dimpling, discharge, masses, tenderness or discharge . CV: PMI is nondisplaced, S1 S2 no gallops, murmurs, rubs. Peripheral pulses are full without delay.No JVD .  ABDOMEN: Bowel sounds normal nontender  No guard  or rebound, no hepato splenomegal no CVA tenderness.   Extremtities:  No clubbing cyanosis or edema, no acute joint swelling or redness no focal atrophy NEURO:  Oriented x3, cranial nerves 3-12 appear to be intact, no obvious focal weakness,gait within normal limits no abnormal reflexes or asymmetrical SKIN: No acute rashes normal turgor, color, no bruising or petechiae. PSYCH: Oriented, good eye contact, no obvious depression anxiety, cognition and judgment appear normal. LN: no cervical axillary  adenopathy  Lab Results  Component Value Date   WBC 3.4* 03/30/2013   HGB 13.1 03/30/2013   HCT 38.8 03/30/2013   PLT 188.0 03/30/2013   GLUCOSE 84 03/30/2013   CHOL 154 03/30/2013   TRIG 59.0 03/30/2013   HDL 57.70 03/30/2013   LDLCALC 85 03/30/2013   ALT 12 03/30/2013   AST 19 03/30/2013   NA 139 03/30/2013   K 4.0 03/30/2013   CL 110 03/30/2013   CREATININE 0.7 03/30/2013   BUN 13 03/30/2013   CO2 23 03/30/2013   TSH 1.78 03/30/2013    ASSESSMENT AND PLAN:  Discussed the following assessment and plan:  Visit for preventive health examination Counseled regarding healthy nutrition, exercise, sleep, injury prevention, calcium vit d and healthy weight . reveiwed labs with patient  Sign up for my chard  Patient Care Team: Madelin Headings, MD as PCP - General Sherron Monday, MD as Consulting Physician (Obstetrics and Gynecology) Patient Instructions  Continue lifestyle intervention healthy eating and exercise .  Preventive Care for Adults, Female A healthy lifestyle and preventive care can promote health and wellness. Preventive health guidelines for women include the following key practices.  A routine yearly physical is a good way to check with your caregiver about your health and preventive screening. It is a chance to share any concerns and updates on your health, and to receive a thorough exam.  Visit your dentist for a routine exam and preventive care every 6 months. Brush  your teeth twice a day and floss once a day. Good oral hygiene prevents tooth decay and gum disease.  The frequency of eye exams is based on your age, health, family medical history, use of contact lenses, and other factors. Follow your caregiver's recommendations for frequency of eye exams.  Eat a healthy diet. Foods like vegetables, fruits, whole grains, low-fat dairy products, and lean protein foods contain the nutrients you need without too many calories. Decrease your intake of foods high in solid fats, added sugars, and salt. Eat the right amount of calories for you.Get information about a proper diet from your caregiver, if necessary.  Regular physical exercise is one of the most important things you can do for your health. Most adults should get at  least 150 minutes of moderate-intensity exercise (any activity that increases your heart rate and causes you to sweat) each week. In addition, most adults need muscle-strengthening exercises on 2 or more days a week.  Maintain a healthy weight. The body mass index (BMI) is a screening tool to identify possible weight problems. It provides an estimate of body fat based on height and weight. Your caregiver can help determine your BMI, and can help you achieve or maintain a healthy weight.For adults 20 years and older:  A BMI below 18.5 is considered underweight.  A BMI of 18.5 to 24.9 is normal.  A BMI of 25 to 29.9 is considered overweight.  A BMI of 30 and above is considered obese.  Maintain normal blood lipids and cholesterol levels by exercising and minimizing your intake of saturated fat. Eat a balanced diet with plenty of fruit and vegetables. Blood tests for lipids and cholesterol should begin at age 58 and be repeated every 5 years. If your lipid or cholesterol levels are high, you are over 50, or you are at high risk for heart disease, you may need your cholesterol levels checked more frequently.Ongoing high lipid and cholesterol  levels should be treated with medicines if diet and exercise are not effective.  If you smoke, find out from your caregiver how to quit. If you do not use tobacco, do not start.  Lung cancer screening is recommended for adults aged 94 80 years who are at high risk for developing lung cancer because of a history of smoking. Yearly low-dose computed tomography (CT) is recommended for people who have at least a 30-pack-year history of smoking and are a current smoker or have quit within the past 15 years. A pack year of smoking is smoking an average of 1 pack of cigarettes a day for 1 year (for example: 1 pack a day for 30 years or 2 packs a day for 15 years). Yearly screening should continue until the smoker has stopped smoking for at least 15 years. Yearly screening should also be stopped for people who develop a health problem that would prevent them from having lung cancer treatment.  If you are pregnant, do not drink alcohol. If you are breastfeeding, be very cautious about drinking alcohol. If you are not pregnant and choose to drink alcohol, do not exceed 1 drink per day. One drink is considered to be 12 ounces (355 mL) of beer, 5 ounces (148 mL) of wine, or 1.5 ounces (44 mL) of liquor.  Avoid use of street drugs. Do not share needles with anyone. Ask for help if you need support or instructions about stopping the use of drugs.  High blood pressure causes heart disease and increases the risk of stroke. Your blood pressure should be checked at least every 1 to 2 years. Ongoing high blood pressure should be treated with medicines if weight loss and exercise are not effective.  If you are 67 to 37 years old, ask your caregiver if you should take aspirin to prevent strokes.  Diabetes screening involves taking a blood sample to check your fasting blood sugar level. This should be done once every 3 years, after age 59, if you are within normal weight and without risk factors for diabetes. Testing should  be considered at a younger age or be carried out more frequently if you are overweight and have at least 1 risk factor for diabetes.  Breast cancer screening is essential preventive care for women. You should practice "breast self-awareness."  This means understanding the normal appearance and feel of your breasts and may include breast self-examination. Any changes detected, no matter how small, should be reported to a caregiver. Women in their 72s and 30s should have a clinical breast exam (CBE) by a caregiver as part of a regular health exam every 1 to 3 years. After age 62, women should have a CBE every year. Starting at age 87, women should consider having a mammography (breast X-ray test) every year. Women who have a family history of breast cancer should talk to their caregiver about genetic screening. Women at a high risk of breast cancer should talk to their caregivers about having magnetic resonance imaging (MRI) and a mammography every year.  Breast cancer gene (BRCA)-related cancer risk assessment is recommended for women who have family members with BRCA-related cancers. BRCA-related cancers include breast, ovarian, tubal, and peritoneal cancers. Having family members with these cancers may be associated with an increased risk for harmful changes (mutations) in the breast cancer genes BRCA1 and BRCA2. Results of the assessment will determine the need for genetic counseling and BRCA1 and BRCA2 testing.  The Pap test is a screening test for cervical cancer. A Pap test can show cell changes on the cervix that might become cervical cancer if left untreated. A Pap test is a procedure in which cells are obtained and examined from the lower end of the uterus (cervix).  Women should have a Pap test starting at age 76.  Between ages 73 and 2, Pap tests should be repeated every 2 years.  Beginning at age 43, you should have a Pap test every 3 years as long as the past 3 Pap tests have been  normal.  Some women have medical problems that increase the chance of getting cervical cancer. Talk to your caregiver about these problems. It is especially important to talk to your caregiver if a new problem develops soon after your last Pap test. In these cases, your caregiver may recommend more frequent screening and Pap tests.  The above recommendations are the same for women who have or have not gotten the vaccine for human papillomavirus (HPV).  If you had a hysterectomy for a problem that was not cancer or a condition that could lead to cancer, then you no longer need Pap tests. Even if you no longer need a Pap test, a regular exam is a good idea to make sure no other problems are starting.  If you are between ages 105 and 52, and you have had normal Pap tests going back 10 years, you no longer need Pap tests. Even if you no longer need a Pap test, a regular exam is a good idea to make sure no other problems are starting.  If you have had past treatment for cervical cancer or a condition that could lead to cancer, you need Pap tests and screening for cancer for at least 20 years after your treatment.  If Pap tests have been discontinued, risk factors (such as a new sexual partner) need to be reassessed to determine if screening should be resumed.  The HPV test is an additional test that may be used for cervical cancer screening. The HPV test looks for the virus that can cause the cell changes on the cervix. The cells collected during the Pap test can be tested for HPV. The HPV test could be used to screen women aged 63 years and older, and should be used in women of any age who have unclear  Pap test results. After the age of 37, women should have HPV testing at the same frequency as a Pap test.  Colorectal cancer can be detected and often prevented. Most routine colorectal cancer screening begins at the age of 74 and continues through age 36. However, your caregiver may recommend screening at  an earlier age if you have risk factors for colon cancer. On a yearly basis, your caregiver may provide home test kits to check for hidden blood in the stool. Use of a small camera at the end of a tube, to directly examine the colon (sigmoidoscopy or colonoscopy), can detect the earliest forms of colorectal cancer. Talk to your caregiver about this at age 70, when routine screening begins. Direct examination of the colon should be repeated every 5 to 10 years through age 23, unless early forms of pre-cancerous polyps or small growths are found.  Hepatitis C blood testing is recommended for all people born from [birth year] through 1965 and any individual with known risks for hepatitis C.  Practice safe sex. Use condoms and avoid high-risk sexual practices to reduce the spread of sexually transmitted infections (STIs). STIs include gonorrhea, chlamydia, syphilis, trichomonas, herpes, HPV, and human immunodeficiency virus (HIV). Herpes, HIV, and HPV are viral illnesses that have no cure. They can result in disability, cancer, and death. Sexually active women aged 51 and younger should be checked for chlamydia. Older women with new or multiple partners should also be tested for chlamydia. Testing for other STIs is recommended if you are sexually active and at increased risk.  Osteoporosis is a disease in which the bones lose minerals and strength with aging. This can result in serious bone fractures. The risk of osteoporosis can be identified using a bone density scan. Women ages 28 and over and women at risk for fractures or osteoporosis should discuss screening with their caregivers. Ask your caregiver whether you should take a calcium supplement or vitamin D to reduce the rate of osteoporosis.  Menopause can be associated with physical symptoms and risks. Hormone replacement therapy is available to decrease symptoms and risks. You should talk to your caregiver about whether hormone replacement therapy is right for  you.  Use sunscreen. Apply sunscreen liberally and repeatedly throughout the day. You should seek shade when your shadow is shorter than you. Protect yourself by wearing long sleeves, pants, a wide-brimmed hat, and sunglasses year round, whenever you are outdoors.  Once a month, do a whole body skin exam, using a mirror to look at the skin on your back. Notify your caregiver of new moles, moles that have irregular borders, moles that are larger than a pencil eraser, or moles that have changed in shape or color.  Stay current with required immunizations.  Influenza vaccine. All adults should be immunized every year.  Tetanus, diphtheria, and acellular pertussis (Td, Tdap) vaccine. Pregnant women should receive 1 dose of Tdap vaccine during each pregnancy. The dose should be obtained regardless of the length of time since the last dose. Immunization is preferred during the 27th to 36th week of gestation. An adult who has not previously received Tdap or who does not know her vaccine status should receive 1 dose of Tdap. This initial dose should be followed by tetanus and diphtheria toxoids (Td) booster doses every 10 years. Adults with an unknown or incomplete history of completing a 3-dose immunization series with Td-containing vaccines should begin or complete a primary immunization series including a Tdap dose. Adults should receive a Td booster  every 10 years.  Varicella vaccine. An adult without evidence of immunity to varicella should receive 2 doses or a second dose if she has previously received 1 dose. Pregnant females who do not have evidence of immunity should receive the first dose after pregnancy. This first dose should be obtained before leaving the health care facility. The second dose should be obtained 4 8 weeks after the first dose.  Human papillomavirus (HPV) vaccine. Females aged 35 26 years who have not received the vaccine previously should obtain the 3-dose series. The vaccine is  not recommended for use in pregnant females. However, pregnancy testing is not needed before receiving a dose. If a female is found to be pregnant after receiving a dose, no treatment is needed. In that case, the remaining doses should be delayed until after the pregnancy. Immunization is recommended for any person with an immunocompromised condition through the age of 26 years if she did not get any or all doses earlier. During the 3-dose series, the second dose should be obtained 4 8 weeks after the first dose. The third dose should be obtained 24 weeks after the first dose and 16 weeks after the second dose.  Zoster vaccine. One dose is recommended for adults aged 88 years or older unless certain conditions are present.  Measles, mumps, and rubella (MMR) vaccine. Adults born before [birth year] generally are considered immune to measles and mumps. Adults born in [birth year] or later should have 1 or more doses of MMR vaccine unless there is a contraindication to the vaccine or there is laboratory evidence of immunity to each of the three diseases. A routine second dose of MMR vaccine should be obtained at least 28 days after the first dose for students attending postsecondary schools, health care workers, or international travelers. People who received inactivated measles vaccine or an unknown type of measles vaccine during 1963 1967 should receive 2 doses of MMR vaccine. People who received inactivated mumps vaccine or an unknown type of mumps vaccine before 1979 and are at high risk for mumps infection should consider immunization with 2 doses of MMR vaccine. For females of childbearing age, rubella immunity should be determined. If there is no evidence of immunity, females who are not pregnant should be vaccinated. If there is no evidence of immunity, females who are pregnant should delay immunization until after pregnancy. Unvaccinated health care workers born before [birth year] who lack laboratory evidence of measles, mumps,  or rubella immunity or laboratory confirmation of disease should consider measles and mumps immunization with 2 doses of MMR vaccine or rubella immunization with 1 dose of MMR vaccine.  Pneumococcal 13-valent conjugate (PCV13) vaccine. When indicated, a person who is uncertain of her immunization history and has no record of immunization should receive the PCV13 vaccine. An adult aged 38 years or older who has certain medical conditions and has not been previously immunized should receive 1 dose of PCV13 vaccine. This PCV13 should be followed with a dose of pneumococcal polysaccharide (PPSV23) vaccine. The PPSV23 vaccine dose should be obtained at least 8 weeks after the dose of PCV13 vaccine. An adult aged 55 years or older who has certain medical conditions and previously received 1 or more doses of PPSV23 vaccine should receive 1 dose of PCV13. The PCV13 vaccine dose should be obtained 1 or more years after the last PPSV23 vaccine dose.  Pneumococcal polysaccharide (PPSV23) vaccine. When PCV13 is also indicated, PCV13 should be obtained first. All adults aged 88 years and older should be  immunized. An adult younger than age 67 years who has certain medical conditions should be immunized. Any person who resides in a nursing home or long-term care facility should be immunized. An adult smoker should be immunized. People with an immunocompromised condition and certain other conditions should receive both PCV13 and PPSV23 vaccines. People with human immunodeficiency virus (HIV) infection should be immunized as soon as possible after diagnosis. Immunization during chemotherapy or radiation therapy should be avoided. Routine use of PPSV23 vaccine is not recommended for American Indians, [address], or people younger than 65 years unless there are medical conditions that require PPSV23 vaccine. When indicated, people who have unknown immunization and have no record of immunization should receive PPSV23 vaccine.  One-time revaccination 5 years after the first dose of PPSV23 is recommended for people aged 28 64 years who have chronic kidney failure, nephrotic syndrome, asplenia, or immunocompromised conditions. People who received 1 2 doses of PPSV23 before age 32 years should receive another dose of PPSV23 vaccine at age 83 years or later if at least 5 years have passed since the previous dose. Doses of PPSV23 are not needed for people immunized with PPSV23 at or after age 5 years.  Meningococcal vaccine. Adults with asplenia or persistent complement component deficiencies should receive 2 doses of quadrivalent meningococcal conjugate (MenACWY-D) vaccine. The doses should be obtained at least 2 months apart. Microbiologists working with certain meningococcal bacteria, military recruits, people at risk during an outbreak, and people who travel to or live in countries with a high rate of meningitis should be immunized. A first-year college student up through age 69 years who is living in a residence hall should receive a dose if she did not receive a dose on or after her 16th birthday. Adults who have certain high-risk conditions should receive one or more doses of vaccine.  Hepatitis A vaccine. Adults who wish to be protected from this disease, have certain high-risk conditions, work with hepatitis A-infected animals, work in hepatitis A research labs, or travel to or work in countries with a high rate of hepatitis A should be immunized. Adults who were previously unvaccinated and who anticipate close contact with an international adoptee during the first 60 days after arrival in the [place] from a country with a high rate of hepatitis A should be immunized.  Hepatitis B vaccine. Adults who wish to be protected from this disease, have certain high-risk conditions, may be exposed to blood or other infectious body fluids, are household contacts or sex partners of hepatitis B positive people, are clients or workers  in certain care facilities, or travel to or work in countries with a high rate of hepatitis B should be immunized.  Haemophilus influenzae type b (Hib) vaccine. A previously unvaccinated person with asplenia or sickle cell disease or having a scheduled splenectomy should receive 1 dose of Hib vaccine. Regardless of previous immunization, a recipient of a hematopoietic stem cell transplant should receive a 3-dose series 6 12 months after her successful transplant. Hib vaccine is not recommended for adults with HIV infection. Preventive Services / Frequency Ages 47 to 83  Blood pressure check.** / Every 1 to 2 years.  Lipid and cholesterol check.** / Every 5 years beginning at age 27.  Clinical breast exam.** / Every 3 years for women in their 46s and 30s.  BRCA-related cancer risk assessment.** / For women who have family members with a BRCA-related cancer (breast, ovarian, tubal, or peritoneal cancers).  Pap test.** / Every 2  years from ages 62 through 100. Every 3 years starting at age 16 through age 57 or 13 with a history of 3 consecutive normal Pap tests.  HPV screening.** / Every 3 years from ages 50 through ages 61 to 64 with a history of 3 consecutive normal Pap tests.  Hepatitis C blood test.** / For any individual with known risks for hepatitis C.  Skin self-exam. / Monthly.  Influenza vaccine. / Every year.  Tetanus, diphtheria, and acellular pertussis (Tdap, Td) vaccine.** / Consult your caregiver. Pregnant women should receive 1 dose of Tdap vaccine during each pregnancy. 1 dose of Td every 10 years.  Varicella vaccine.** / Consult your caregiver. Pregnant females who do not have evidence of immunity should receive the first dose after pregnancy.  HPV vaccine. / 3 doses over 6 months, if 26 and younger. The vaccine is not recommended for use in pregnant females. However, pregnancy testing is not needed before receiving a dose.  Measles, mumps, rubella (MMR) vaccine.** / You  need at least 1 dose of MMR if you were born in 1957 or later. You may also need a 2nd dose. For females of childbearing age, rubella immunity should be determined. If there is no evidence of immunity, females who are not pregnant should be vaccinated. If there is no evidence of immunity, females who are pregnant should delay immunization until after pregnancy.  Pneumococcal 13-valent conjugate (PCV13) vaccine.** / Consult your caregiver.  Pneumococcal polysaccharide (PPSV23) vaccine.** / 1 to 2 doses if you smoke cigarettes or if you have certain conditions.  Meningococcal vaccine.** / 1 dose if you are age 8 to 80 years and a Orthoptist living in a residence hall, or have one of several medical conditions, you need to get vaccinated against meningococcal disease. You may also need additional booster doses.  Hepatitis A vaccine.** / Consult your caregiver.  Hepatitis B vaccine.** / Consult your caregiver.  Haemophilus influenzae type b (Hib) vaccine.** / Consult your caregiver. Ages 81 to 91  Blood pressure check.** / Every 1 to 2 years.  Lipid and cholesterol check.** / Every 5 years beginning at age 7.  Lung cancer screening. / Every year if you are aged 20 80 years and have a 30-pack-year history of smoking and currently smoke or have quit within the past 15 years. Yearly screening is stopped once you have quit smoking for at least 15 years or develop a health problem that would prevent you from having lung cancer treatment.  Clinical breast exam.** / Every year after age 99.  BRCA-related cancer risk assessment.** / For women who have family members with a BRCA-related cancer (breast, ovarian, tubal, or peritoneal cancers).  Mammogram.** / Every year beginning at age 55 and continuing for as long as you are in good health. Consult with your caregiver.  Pap test.** / Every 3 years starting at age 17 through age 48 or 65 with a history of 3 consecutive normal Pap  tests.  HPV screening.** / Every 3 years from ages 64 through ages 51 to 92 with a history of 3 consecutive normal Pap tests.  Fecal occult blood test (FOBT) of stool. / Every year beginning at age 35 and continuing until age 59. You may not need to do this test if you get a colonoscopy every 10 years.  Flexible sigmoidoscopy or colonoscopy.** / Every 5 years for a flexible sigmoidoscopy or every 10 years for a colonoscopy beginning at age 25 and continuing until age 89.  Hepatitis C blood test.** / For all people born from [birth year] through 1965 and any individual with known risks for hepatitis C.  Skin self-exam. / Monthly.  Influenza vaccine. / Every year.  Tetanus, diphtheria, and acellular pertussis (Tdap/Td) vaccine.** / Consult your caregiver. Pregnant women should receive 1 dose of Tdap vaccine during each pregnancy. 1 dose of Td every 10 years.  Varicella vaccine.** / Consult your caregiver. Pregnant females who do not have evidence of immunity should receive the first dose after pregnancy.  Zoster vaccine.** / 1 dose for adults aged 32 years or older.  Measles, mumps, rubella (MMR) vaccine.** / You need at least 1 dose of MMR if you were born in 1957 or later. You may also need a 2nd dose. For females of childbearing age, rubella immunity should be determined. If there is no evidence of immunity, females who are not pregnant should be vaccinated. If there is no evidence of immunity, females who are pregnant should delay immunization until after pregnancy.  Pneumococcal 13-valent conjugate (PCV13) vaccine.** / Consult your caregiver.  Pneumococcal polysaccharide (PPSV23) vaccine.** / 1 to 2 doses if you smoke cigarettes or if you have certain conditions.  Meningococcal vaccine.** / Consult your caregiver.  Hepatitis A vaccine.** / Consult your caregiver.  Hepatitis B vaccine.** / Consult your caregiver.  Haemophilus influenzae type b (Hib) vaccine.** / Consult your  caregiver. Ages 69 and over  Blood pressure check.** / Every 1 to 2 years.  Lipid and cholesterol check.** / Every 5 years beginning at age 92.  Lung cancer screening. / Every year if you are aged 35 80 years and have a 30-pack-year history of smoking and currently smoke or have quit within the past 15 years. Yearly screening is stopped once you have quit smoking for at least 15 years or develop a health problem that would prevent you from having lung cancer treatment.  Clinical breast exam.** / Every year after age 66.  BRCA-related cancer risk assessment.** / For women who have family members with a BRCA-related cancer (breast, ovarian, tubal, or peritoneal cancers).  Mammogram.** / Every year beginning at age 23 and continuing for as long as you are in good health. Consult with your caregiver.  Pap test.** / Every 3 years starting at age 62 through age 28 or 47 with a 3 consecutive normal Pap tests. Testing can be stopped between 65 and 70 with 3 consecutive normal Pap tests and no abnormal Pap or HPV tests in the past 10 years.  HPV screening.** / Every 3 years from ages 66 through ages 55 or 4 with a history of 3 consecutive normal Pap tests. Testing can be stopped between 65 and 70 with 3 consecutive normal Pap tests and no abnormal Pap or HPV tests in the past 10 years.  Fecal occult blood test (FOBT) of stool. / Every year beginning at age 44 and continuing until age 11. You may not need to do this test if you get a colonoscopy every 10 years.  Flexible sigmoidoscopy or colonoscopy.** / Every 5 years for a flexible sigmoidoscopy or every 10 years for a colonoscopy beginning at age 89 and continuing until age 83.  Hepatitis C blood test.** / For all people born from [birth year] through 1965 and any individual with known risks for hepatitis C.  Osteoporosis screening.** / A one-time screening for women ages 73 and over and women at risk for fractures or osteoporosis.  Skin self-exam. /  Monthly.  Influenza vaccine. / Every year.  Tetanus, diphtheria, and acellular pertussis (Tdap/Td) vaccine.** / 1 dose of Td every 10 years.  Varicella vaccine.** / Consult your caregiver.  Zoster vaccine.** / 1 dose for adults aged 58 years or older.  Pneumococcal 13-valent conjugate (PCV13) vaccine.** / Consult your caregiver.  Pneumococcal polysaccharide (PPSV23) vaccine.** / 1 dose for all adults aged 94 years and older.  Meningococcal vaccine.** / Consult your caregiver.  Hepatitis A vaccine.** / Consult your caregiver.  Hepatitis B vaccine.** / Consult your caregiver.  Haemophilus influenzae type b (Hib) vaccine.** / Consult your caregiver. ** Family history and personal history of risk and conditions may change your caregiver's recommendations. Document Released: 05/25/2001 Document Revised: 07/24/2012 Document Reviewed: 08/24/2010 [facility] Patient Information 2014 [place], [place].     Neta Mends. Panosh M.D.

## 2013-04-06 NOTE — Progress Notes (Signed)
Pre visit review using our clinic review tool, if applicable. No additional management support is needed unless otherwise documented below in the visit note. 

## 2013-04-07 NOTE — Addendum Note (Signed)
Addended by: Azucena Freed on: 04/07/2013 09:28 AM   Modules accepted: Orders

## 2014-02-11 ENCOUNTER — Encounter: Payer: Self-pay | Admitting: Internal Medicine

## 2017-07-20 ENCOUNTER — Other Ambulatory Visit: Payer: Self-pay | Admitting: Obstetrics and Gynecology

## 2017-07-20 DIAGNOSIS — R928 Other abnormal and inconclusive findings on diagnostic imaging of breast: Secondary | ICD-10-CM

## 2017-07-25 ENCOUNTER — Ambulatory Visit
Admission: RE | Admit: 2017-07-25 | Discharge: 2017-07-25 | Disposition: A | Discharge status: Home / Self Care | Payer: 59 | Source: Ambulatory Visit | Attending: Obstetrics and Gynecology | Admitting: Obstetrics and Gynecology

## 2017-07-25 DIAGNOSIS — R928 Other abnormal and inconclusive findings on diagnostic imaging of breast: Secondary | ICD-10-CM

## 2018-09-19 ENCOUNTER — Other Ambulatory Visit: Payer: Self-pay | Admitting: Obstetrics and Gynecology

## 2018-09-19 DIAGNOSIS — N6489 Other specified disorders of breast: Secondary | ICD-10-CM

## 2018-09-20 ENCOUNTER — Other Ambulatory Visit: Payer: Self-pay | Admitting: Obstetrics and Gynecology

## 2018-09-20 DIAGNOSIS — N6489 Other specified disorders of breast: Secondary | ICD-10-CM

## 2018-09-29 ENCOUNTER — Ambulatory Visit
Admission: RE | Admit: 2018-09-29 | Discharge: 2018-09-29 | Disposition: A | Discharge status: Home / Self Care | Payer: 59 | Source: Ambulatory Visit | Attending: Obstetrics and Gynecology | Admitting: Obstetrics and Gynecology

## 2018-09-29 ENCOUNTER — Other Ambulatory Visit: Payer: Self-pay

## 2018-09-29 ENCOUNTER — Other Ambulatory Visit: Payer: Self-pay | Admitting: Obstetrics and Gynecology

## 2018-09-29 DIAGNOSIS — N6489 Other specified disorders of breast: Secondary | ICD-10-CM

## 2018-10-03 ENCOUNTER — Ambulatory Visit
Admission: RE | Admit: 2018-10-03 | Discharge: 2018-10-03 | Disposition: A | Discharge status: Home / Self Care | Payer: 59 | Source: Ambulatory Visit | Attending: Obstetrics and Gynecology | Admitting: Obstetrics and Gynecology

## 2018-10-03 ENCOUNTER — Other Ambulatory Visit: Payer: Self-pay

## 2018-10-03 DIAGNOSIS — N6489 Other specified disorders of breast: Secondary | ICD-10-CM

## 2018-10-06 ENCOUNTER — Other Ambulatory Visit: Payer: Self-pay | Admitting: Obstetrics and Gynecology

## 2018-10-06 DIAGNOSIS — R928 Other abnormal and inconclusive findings on diagnostic imaging of breast: Secondary | ICD-10-CM

## 2018-10-23 ENCOUNTER — Other Ambulatory Visit: Payer: Self-pay

## 2018-10-23 ENCOUNTER — Ambulatory Visit
Admission: RE | Admit: 2018-10-23 | Discharge: 2018-10-23 | Disposition: A | Discharge status: Home / Self Care | Payer: 59 | Source: Ambulatory Visit | Attending: Obstetrics and Gynecology | Admitting: Obstetrics and Gynecology

## 2018-10-23 DIAGNOSIS — R928 Other abnormal and inconclusive findings on diagnostic imaging of breast: Secondary | ICD-10-CM

## 2018-10-23 MED ORDER — GADOBUTROL 1 MMOL/ML IV SOLN
6.0000 mL | Freq: Once | INTRAVENOUS | Status: AC | PRN
  Administered 2018-10-23: 6 mL via INTRAVENOUS

## 2019-03-19 ENCOUNTER — Other Ambulatory Visit: Payer: Self-pay | Admitting: Obstetrics and Gynecology

## 2019-03-19 ENCOUNTER — Other Ambulatory Visit: Payer: Self-pay | Admitting: Internal Medicine

## 2019-03-19 DIAGNOSIS — N6489 Other specified disorders of breast: Secondary | ICD-10-CM

## 2019-03-22 ENCOUNTER — Other Ambulatory Visit: Payer: Self-pay

## 2019-03-22 DIAGNOSIS — Z20822 Contact with and (suspected) exposure to covid-19: Secondary | ICD-10-CM

## 2019-03-24 LAB — NOVEL CORONAVIRUS, NAA: SARS-CoV-2, NAA: NOT DETECTED

## 2019-03-28 ENCOUNTER — Ambulatory Visit: Payer: 59 | Attending: Internal Medicine

## 2019-03-28 ENCOUNTER — Other Ambulatory Visit: Payer: Self-pay

## 2019-03-28 DIAGNOSIS — Z20822 Contact with and (suspected) exposure to covid-19: Secondary | ICD-10-CM

## 2019-03-30 LAB — NOVEL CORONAVIRUS, NAA: SARS-CoV-2, NAA: NOT DETECTED

## 2019-04-12 ENCOUNTER — Ambulatory Visit: Payer: 59 | Attending: Internal Medicine

## 2019-04-12 DIAGNOSIS — Z20822 Contact with and (suspected) exposure to covid-19: Secondary | ICD-10-CM

## 2019-04-14 LAB — NOVEL CORONAVIRUS, NAA: SARS-CoV-2, NAA: NOT DETECTED

## 2019-04-25 ENCOUNTER — Other Ambulatory Visit: Payer: Self-pay

## 2019-04-25 ENCOUNTER — Ambulatory Visit
Admission: RE | Admit: 2019-04-25 | Discharge: 2019-04-25 | Disposition: A | Discharge status: Home / Self Care | Payer: 59 | Source: Ambulatory Visit | Attending: Obstetrics and Gynecology | Admitting: Obstetrics and Gynecology

## 2019-04-25 ENCOUNTER — Ambulatory Visit: Payer: 59

## 2019-04-25 DIAGNOSIS — N6489 Other specified disorders of breast: Secondary | ICD-10-CM

## 2019-09-18 LAB — HM PAP SMEAR

## 2020-02-21 ENCOUNTER — Ambulatory Visit: Payer: 59 | Attending: Internal Medicine

## 2020-02-21 DIAGNOSIS — Z23 Encounter for immunization: Secondary | ICD-10-CM

## 2020-02-21 NOTE — Progress Notes (Signed)
   Covid-19 Vaccination Clinic  Name:  Becky Hoover    MRN: [id number] DOB: [birth year]  02/21/2020  Becky Hoover was observed post Covid-19 immunization for 15 minutes without incident. She was provided with Vaccine Information Sheet and instruction to access the V-Safe system.   Becky Hoover was instructed to call 911 with any severe reactions post vaccine: Marland Kitchen Difficulty breathing  . Swelling of face and throat  . A fast heartbeat  . A bad rash all over body  . Dizziness and weakness

## 2020-06-18 IMAGING — MG DIGITAL DIAGNOSTIC UNILATERAL LEFT MAMMOGRAM WITH TOMO AND CAD
4 of 12 series · 4 of 40 positions shown · non-contrast
Comparison: Previous exam(s).
COMPARISON: Previous exam(s).

Addendum:
CLINICAL DATA: Patient recalled from screening for left breast
asymmetry.

EXAM:
DIGITAL DIAGNOSTIC LEFT MAMMOGRAM WITH CAD AND TOMO
ULTRASOUND LEFT BREAST

[L XCCL synth-2D]
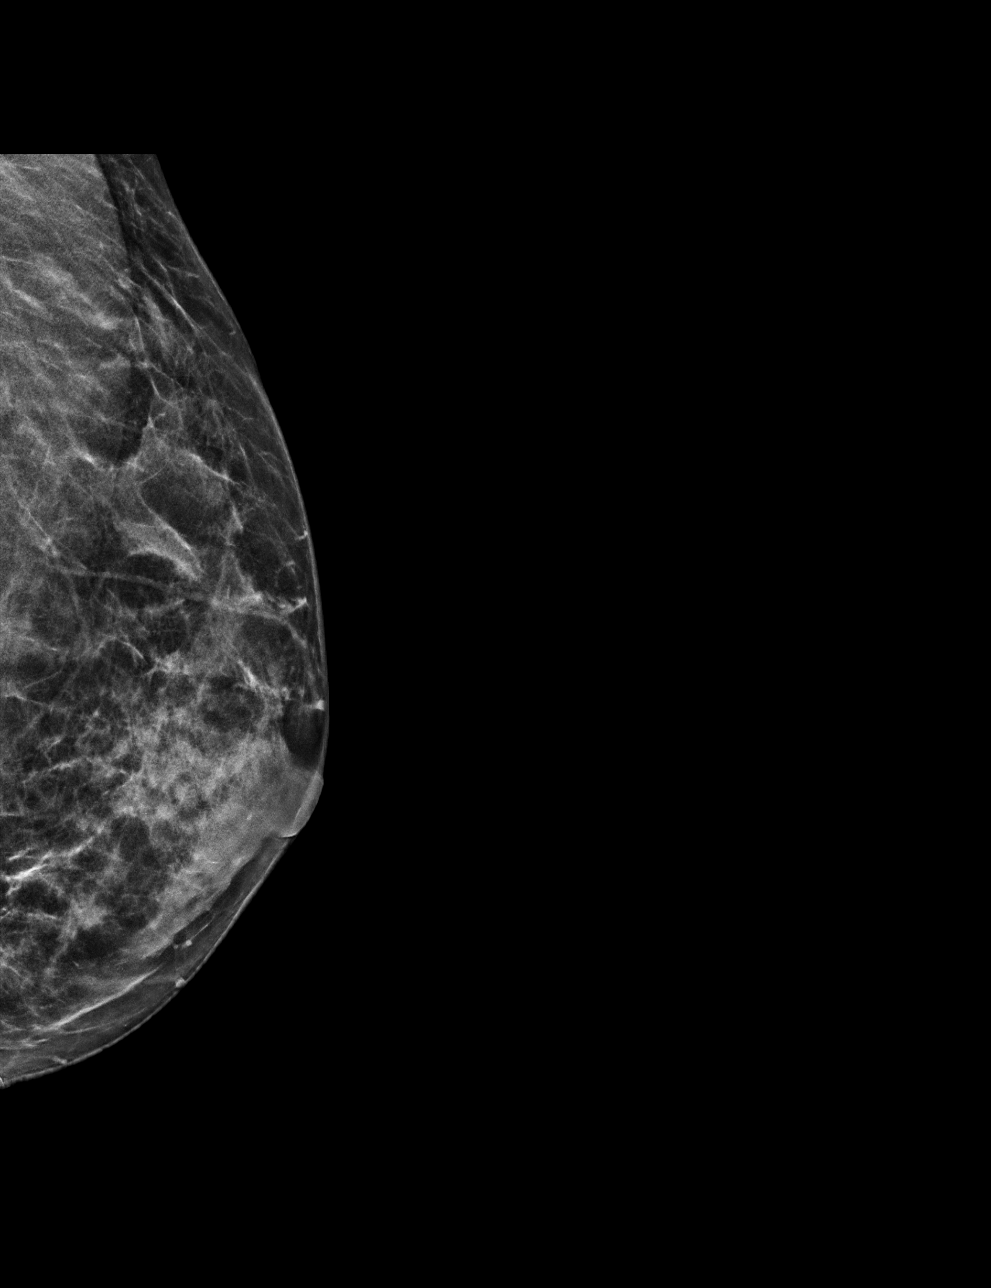

[L ML synth-2D]
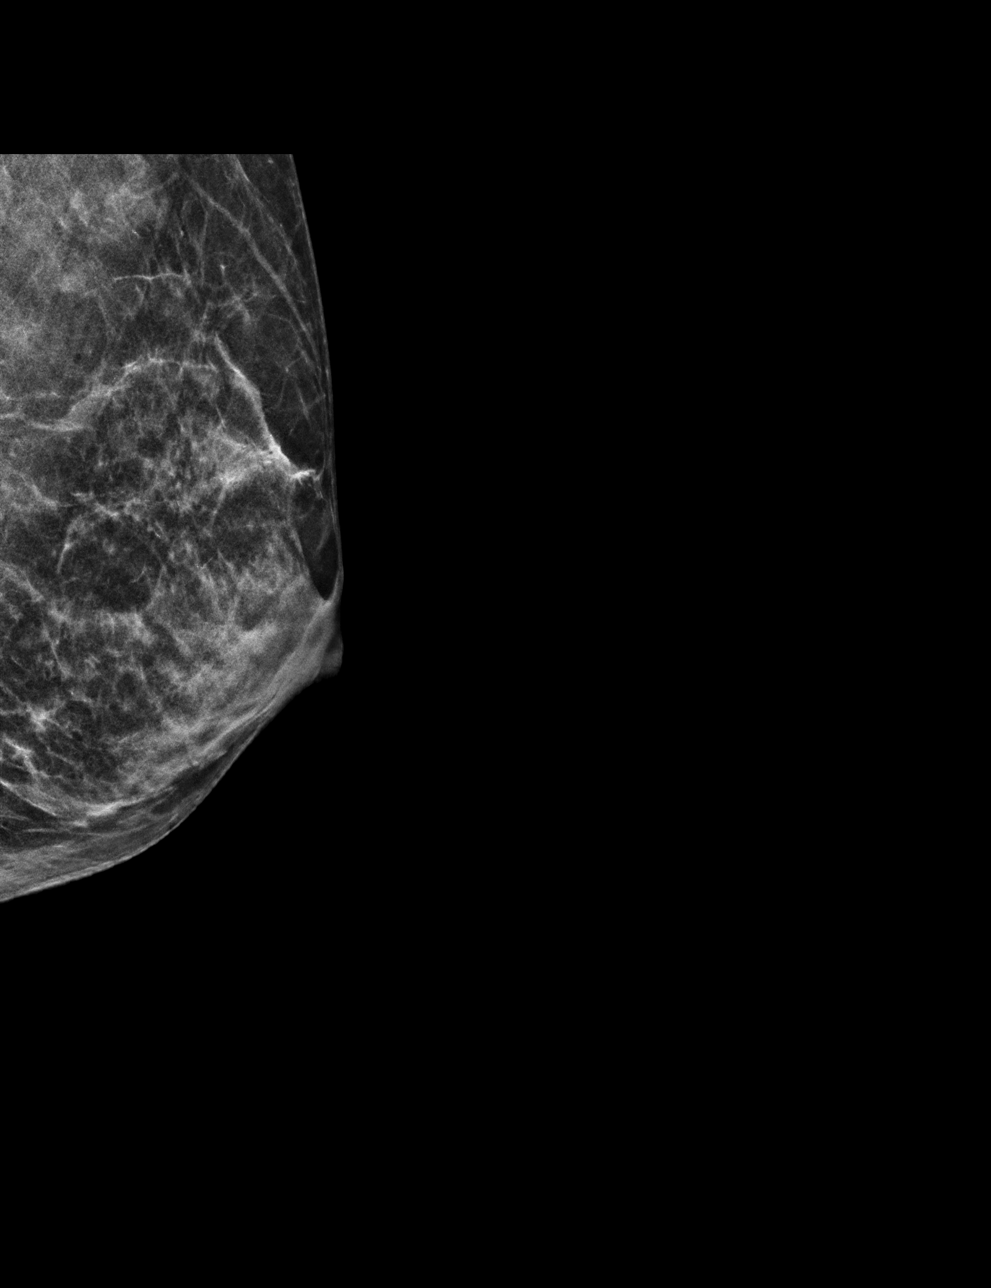

[L MLO synth-2D]
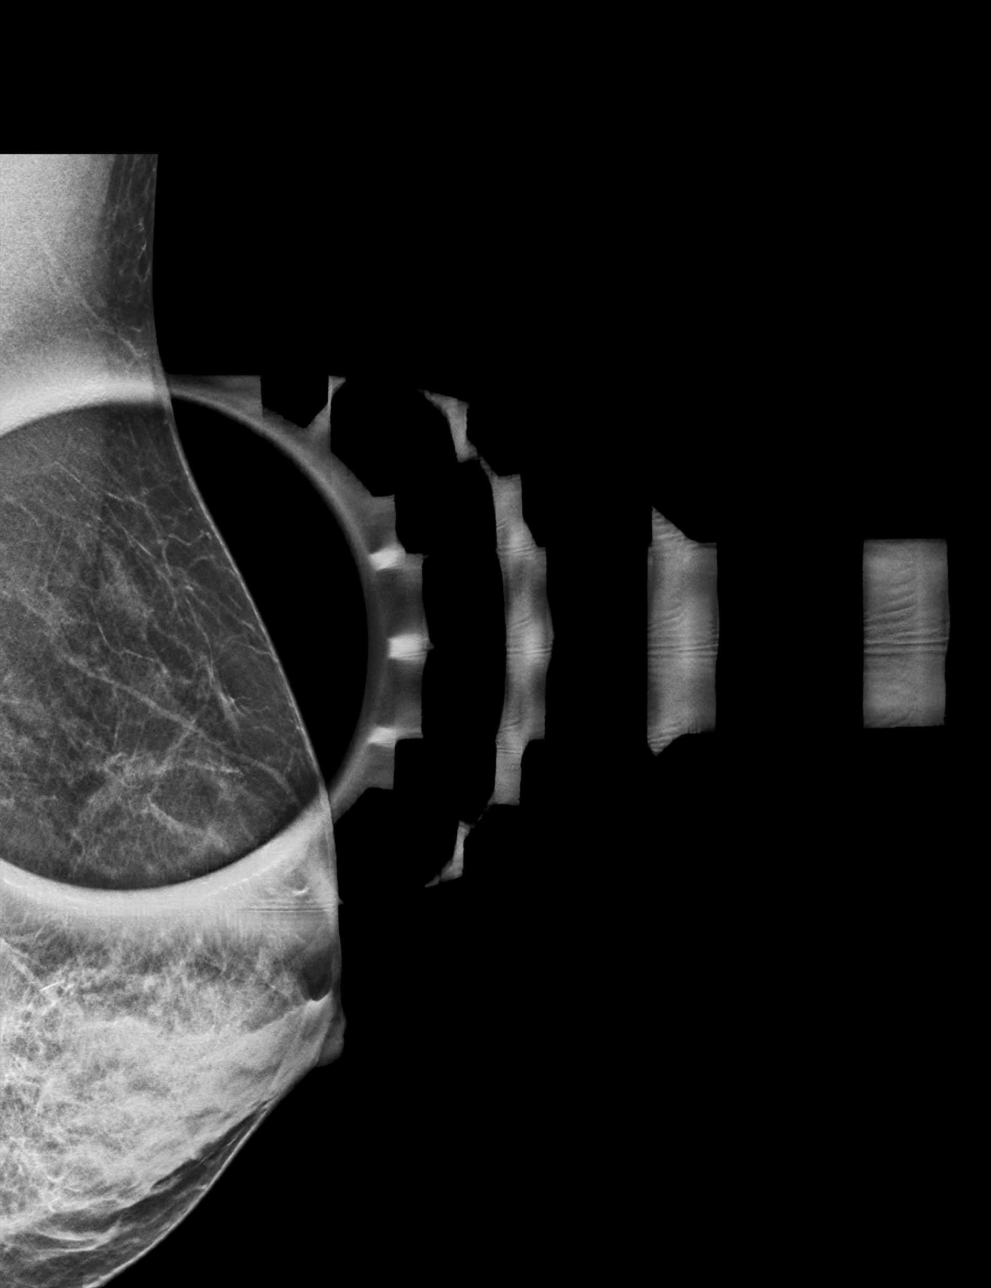

[L CC tomo · tomo slice 32/47.0]
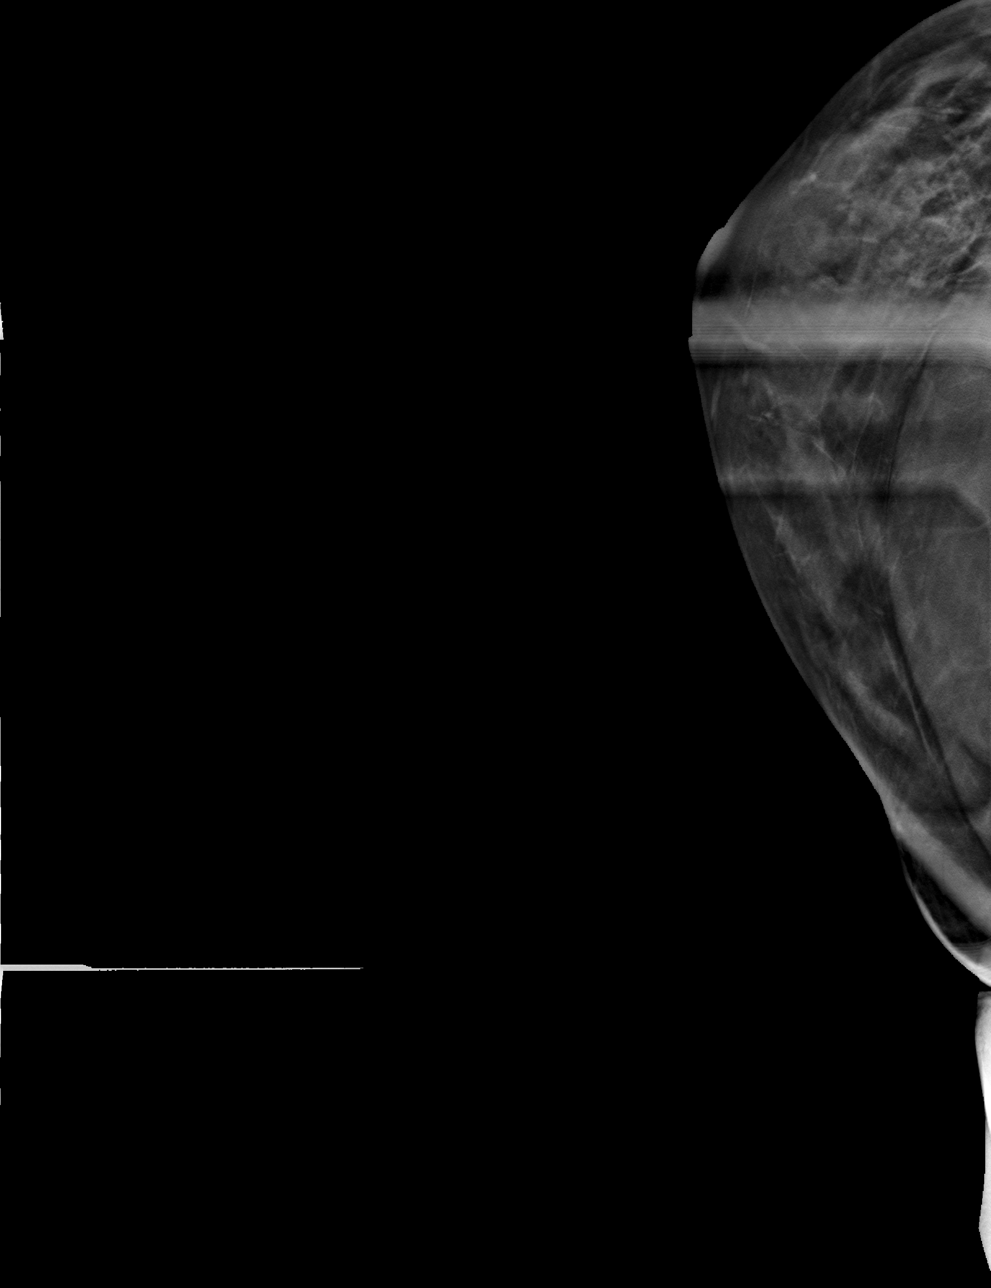

[4 of 40 positions shown; findings below may reference images not displayed]

ACR Breast Density Category c: The breast tissue is heterogeneously
dense, which may obscure small masses.
FINDINGS: Within the upper-outer left breast posterior depth there is a
persistent developing asymmetry, further evaluated with full paddle
true lateral tomosynthesis images and spot compression MLO
tomosynthesis images.

Mammographic images were processed with CAD.

On physical exam, no discrete mass is palpated within the
upper-outer left breast.

Targeted ultrasound is performed, showing dense tissue without
discrete mass within the upper-outer left breast. No left axillary
adenopathy. No definite sonographic correlate for focal developing
asymmetry upper outer left breast.
IMPRESSION: Developing asymmetry upper outer left breast.

RECOMMENDATION:
Stereotactic guided core needle biopsy developing asymmetry upper
outer left breast.

I have discussed the findings and recommendations with the patient.
Results were also provided in writing at the conclusion of the
visit. If applicable, a reminder letter will be sent to the patient
regarding the next appointment.

BI-RADS CATEGORY  4: Suspicious.

ADDENDUM:
The patient presented for a schedule stereotactic core needle biopsy
of her left breast on Wednesday October, 2018 for a superior left breast
asymmetry seen on MLO view. She was position, using MLO and
exaggerated CC projections, however the asymmetry seen on the
diagnostic mammogram in far superior left breast could not be
reproduced. Several attempts were made, and given no reliable target
could be found, the procedure was canceled.

Given this, further evaluation with contrast-enhanced MRI of the
breast is recommended. If the abnormality is reproduced by MRI, then
MRI guided core needle biopsy or marker/seed placement may be
performed. If the MRI exam does not demonstrate corresponding
abnormality, then the patient may return to screening.

These findings were discussed in detail with the patient at the time
of her visit.

*** End of Addendum ***
ACR Breast Density Category c: The breast tissue is heterogeneously
dense, which may obscure small masses.
FINDINGS: Within the upper-outer left breast posterior depth there is a
persistent developing asymmetry, further evaluated with full paddle
true lateral tomosynthesis images and spot compression MLO
tomosynthesis images.

Mammographic images were processed with CAD.

On physical exam, no discrete mass is palpated within the
upper-outer left breast.

Targeted ultrasound is performed, showing dense tissue without
discrete mass within the upper-outer left breast. No left axillary
adenopathy. No definite sonographic correlate for focal developing
asymmetry upper outer left breast.
IMPRESSION: Developing asymmetry upper outer left breast.

RECOMMENDATION:
Stereotactic guided core needle biopsy developing asymmetry upper
outer left breast.

I have discussed the findings and recommendations with the patient.
Results were also provided in writing at the conclusion of the
visit. If applicable, a reminder letter will be sent to the patient
regarding the next appointment.

BI-RADS CATEGORY  4: Suspicious.

## 2021-10-27 ENCOUNTER — Ambulatory Visit: Payer: 59 | Admitting: Sports Medicine

## 2021-10-27 VITALS — BP 113/70 | Ht 65.0 in | Wt 138.0 lb

## 2021-10-27 DIAGNOSIS — G629 Polyneuropathy, unspecified: Secondary | ICD-10-CM | POA: Diagnosis not present

## 2021-10-28 NOTE — Progress Notes (Signed)
   Subjective:    Patient ID: Becky Hoover, female    DOB: 1975-07-13, 46 y.o.   MRN: [id number]  HPI chief complaint: Bilateral hand and foot numbness  Patient is a very pleasant 46 year old female who comes in today complaining of approximately 2 months of bilateral hand and feet numbness.  She first noticed numbness in her hands.  She localizes it to the ulnar aspect of both hands involving the fourth and fifth digits.  She denies numbness proximal to this in the wrist or forearm.  She denies numbness or tingling.  No significant pain.  It does not seem to be exacerbated by any certain activity.  She enjoys playing tennis and although she denies any weakness, she does admit to an occasional inability to release the tennis ball with her left hand when serving.  The numbness in her feet also involve the lateral aspect of her feet dorsally as well as some numbness in the plantar aspect of the foot.  No numbness or tingling proximal to this.  She is otherwise healthy.  Past medical history reviewed Medications reviewed Allergies reviewed    Review of Systems As above    Objective:   Physical Exam  Well-developed, well-nourished.  No acute distress  Neurological exam: There is no obvious atrophy of either the upper or lower extremities.  There is a negative Tinel's at the cubital tunnel and carpal tunnel bilaterally.  Good pulses.  Negative Tinel over Guyon's canal bilaterally.  Good strength in both upper and lower extremities.      Assessment & Plan:   Bilateral hand and foot numbness of unknown etiology  I explained to Breelyn that it is highly unlikely that her symptoms are arising from any sort of orthopedic condition.  I recommended consultation with Dr. Lucia Gaskins.  Patient is in agreement with that plan.  Her symptoms are not bad enough that she would like to try gabapentin at this time.  I will defer work-up and treatment to the discretion of Dr. Lucia Gaskins and the patient will follow-up  with me as needed.  This note was dictated using Dragon naturally speaking software and may contain errors in syntax, spelling, or content which have not been identified prior to signing this note.

## 2021-12-02 ENCOUNTER — Encounter: Payer: Self-pay | Admitting: Neurology

## 2021-12-02 ENCOUNTER — Ambulatory Visit: Payer: 59 | Admitting: Neurology

## 2021-12-02 VITALS — BP 103/64 | HR 86 | Ht 65.5 in | Wt 142.2 lb

## 2021-12-02 DIAGNOSIS — G5623 Lesion of ulnar nerve, bilateral upper limbs: Secondary | ICD-10-CM | POA: Diagnosis not present

## 2021-12-02 DIAGNOSIS — R200 Anesthesia of skin: Secondary | ICD-10-CM

## 2021-12-02 DIAGNOSIS — G5731 Lesion of lateral popliteal nerve, right lower limb: Secondary | ICD-10-CM | POA: Diagnosis not present

## 2021-12-02 NOTE — Patient Instructions (Addendum)
Ulnar and peroneal neuropathy, try conservative measures first and send to OT/PT([facility]). Discussed multiple options, see HPI. At this time will send to OT/PT and try conservatives measures and perform a limited serum panel for other etiologies that can make pressure palsies more commone ie B!2 deficiency, diabetes. Other options as below:  Ulnar Neuropathy options: conservative measures, resting it, not bending or placing elbows on table, and at night wearing elbow braces. IF it doesn't get better with conservative measure, we could do emg/ncs and transposition.And can send to OT for this.   Sounds like a peroneal neuropathy/neuritis: Options: conservative, try to keep legs neutral, have OT address it when addressing ulnar neuropathy, could also avoid crossing legs and sitting more neutrally. If doesn't help would also xray then MRi the knee to se eif any bony or other abnormality causing pinching of the peroneal nerve.   Hereditary neuropathy with pressure palsies (HNPP) is an inherited condition that causes numbness, tingling and muscle weakness in the limbs. It affects the peripheral nerves in locations we expect to see pressure neuropathies like CTS or ulnar neuropathy or peroneal neuropathy. Father is 48 and had trigger finger, mother has arthritis no FHx of any neuropathies so much less likely  Other option is to complete a panel of blood work to ensure no diabetes or B12 deficiency or other things that could make nerves more susceptible. Since likely nerve entrapments, could perform a limited blood serum panel.

## 2021-12-02 NOTE — Progress Notes (Signed)
[facility]    Provider:  Dr Lucia Gaskins Requesting Provider: Ralene Cork, DO Primary Care Provider:  Madelin Headings, MD  CC:  numbness  HPI:  Becky Hoover is a 46 y.o. female here as requested by Ralene Cork, DO for peripheral neuritis. No significant past medical history. I reviewed Dr. Gilmer Mor notes: Patient was seen approximately a month ago complaining of 2 months of bilateral hand and feet numbness, she first noticed numbness in her hands, more localized to the ulnar aspect involving the fourth and fifth digits, no significant pain, not exacerbated by any certain activity, she does enjoy playing tennis, she denies weakness, numbness in her feet also involve the lateral aspect of her feet dorsally as well as some numbness in the plantar aspect of the foot, no numbness or tingling proximal to this, she is otherwise healthy.   She has a feeling like fingers are swollen and numb no tingling and wakes her up at night, last 3 fingers on each hand. Started since the summer, getting worse, has gotten a little better the last few weeks prior to that was waking her up every night, moving the fingers and arms would make it go away. Not coming from the neck. She plays tennis and sits at a desk and bends elbows. options: conservative measures, resting it, not bending or placing elbows on table, and at night wearing elbow braces. IF it doesn't get better with conservative measure, we could do emg/ncs and transposition.  For feet it is in her right and it is in her toes, last 2 toes, no back pain, no shooting pain from the knee, she runs a lot 3-4 times a week, sits cross legged. Happens when sitting too long or crossing legs. No low back pain. Getting up and walking makes it go away. Sounds like a peroneal neuropathy/neuritis: Options: conservative, try to keep legs neutral, have OT address it when addressing ulnar neuropathy, could also avoid crossing legs and sitting more  neutrally. If doesn't help would also xray then MRi the knee to se eif any bony or other abnormality causing pinching of the peroneal nerve.   Hereditary neuropathy with pressure palsies (HNPP) is an inherited condition that causes numbness, tingling and muscle weakness in the limbs. It affects the peripheral nerves in locations we expect to see pressure neuropathies like CTS or ulnar neuropathy or peroneal neuropathy. Father is 62 and had trigger finger, mother has arthritis no FHx of any neuropathies so much less likely  Other option is to complete a panel of blood work to ensure no diabetes or B12 deficiency or other things that could make nerves more susceptible. Since likely nerve entrapments, could perform a limited blood serum panel.  Review of Systems: Patient complains of symptoms per HPI as well as the following symptoms numbness. Pertinent negatives and positives per HPI. All others negative.   Social History   Socioeconomic History   Marital status: Married    Spouse name: Not on file   Number of children: Not on file   Years of education: Not on file   Highest education level: Not on file  Occupational History   Not on file  Tobacco Use   Smoking status: Never   Smokeless tobacco: Never  Vaping Use   Vaping Use: Never used  Substance and Sexual Activity   Alcohol use: Yes    Alcohol/week: 2.0 standard drinks of alcohol    Types: 2 Glasses of wine per week  Comment: 1-2 per week   Drug use: No   Sexual activity: Yes  Other Topics Concern   Not on file  Social History Narrative   Lung Cancer PGMHeart Disease PGF CAD at  Of 4 Husband No petsMasters degree had 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    Caffiene cups 1-2 daily.    Social Determinants of Health   Financial Resource Strain: Not on file  Food Insecurity: Not on file  Transportation Needs: Not on file  Physical Activity: Not on file  Stress: Not on file   Social Connections: Not on file  Intimate Partner Violence: Not on file    Family History  Problem Relation Age of Onset   Breast cancer Mother 15   Alcohol abuse Father    Heart disease Father    Breast cancer Maternal Aunt 37       possibly triple negative   Hypertension Maternal Grandmother    Lung cancer Paternal Grandmother    Heart disease Paternal Grandfather    Prostate cancer Maternal Uncle 28   Breast cancer Other        MGM's two sisters   Breast cancer Other        MGM's mother (great grandmother)    Past Medical History:  Diagnosis Date   Allergy    No pertinent past medical history    SVD (spontaneous vaginal delivery) 09/01/2011   Varicella     Patient Active Problem List   Diagnosis Date Noted   SVD (spontaneous vaginal delivery) 09/01/2011   ALLERGIC RHINITIS 06/13/2007    Past Surgical History:  Procedure Laterality Date   NO PAST SURGERIES      Current Outpatient Medications  Medication Sig Dispense Refill   levonorgestrel (MIRENA) 20 MCG/24HR IUD 1 each by Intrauterine route once.     No current facility-administered medications for this visit.    Allergies as of 12/02/2021   (No Known Allergies)    Vitals: BP 103/64   Pulse 86   Ht 5' 5.5" (1.664 m)   Wt 142 lb 3.2 oz (64.5 kg)   BMI 23.30 kg/m  Last Weight:  Wt Readings from Last 1 Encounters:  12/02/21 142 lb 3.2 oz (64.5 kg)   Last Height:   Ht Readings from Last 1 Encounters:  12/02/21 5' 5.5" (1.664 m)     Physical exam: Exam: Gen: NAD, conversant, well nourised, obese, well groomed                     CV: RRR, no MRG. No Carotid Bruits. No peripheral edema, warm, nontender Eyes: Conjunctivae clear without exudates or hemorrhage  Neuro: Detailed Neurologic Exam  Speech:    Speech is normal; fluent and spontaneous with normal comprehension.  Cognition:    The patient is oriented to person, place, and time;     recent and remote memory intact;     language  fluent;     normal attention, concentration,     fund of knowledge Cranial Nerves:    The pupils are equal, round, and reactive to light. Pupils too small to visualize fundi attempted. Visual fields are full to finger confrontation. Extraocular movements are intact. Trigeminal sensation is intact and the muscles of mastication are normal. The face is symmetric. The palate elevates in the midline. Hearing intact. Voice is normal. Shoulder shrug is normal. The tongue has normal motion without fasciculations.  Coordination:    Normal   Gait: normal.   Motor Observation:    No asymmetry, no atrophy, and no involuntary movements noted. Tone:    Normal muscle tone.    Posture:    Posture is normal. normal erect    Strength: weakness distal FDPs(Ulnar innervated) in hands and slight weakness also eversion right foot and little difficulty walking on heel of right foot so possibly very mild right foot dorsiflexion weakness. (peroneal innervated) otherwise    Strength is V/V in the upper and lower limbs.      Sensation: intact to LT     Reflex Exam:  DTR's:    Deep tendon reflexes in the upper and lower extremities are normal bilaterally.   Toes:    The toes are downgoing bilaterally.   Clonus:    Clonus is absent.    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 weakness distal FDPs(Ulnar innervated) in hands and slight weakness also eversion right foot and little difficulty walking on heel of right foot so possibly very mild right foot dosiflexion weakness. (peroneal innervated) otherwise    Strength is V/V in the upper and lower limbs.   - discussed multiple options, see HPI and below. At this time will send to OT/PT and try conservatives measures and perform a limited serum panel for other etiologies that can make pressure palsies more common ie B!2 deficiency, diabetes.   OPTIONS DISCUSSED  Ulnar  Neuropathy options: conservative measures, resting it, not bending or placing elbows on table, and at night wearing elbow braces. IF it doesn't get better with conservative measure, we could do emg/ncs and transposition.And can send to OT for this.   Sounds like a peroneal neuropathy/neuritis: Options: conservative, try to keep legs neutral, have OT address it when addressing ulnar neuropathy, could also avoid crossing legs and sitting more neutrally. If doesn't help would also xray then MRi the knee to se eif any bony or other abnormality causing pinching of the peroneal nerve.   Hereditary neuropathy with pressure palsies (HNPP) is an inherited condition that causes numbness, tingling and muscle weakness in the limbs. It affects the peripheral nerves in locations we expect to see pressure neuropathies like CTS or ulnar neuropathy or peroneal neuropathy. Father is 42 and had trigger finger, mother has arthritis no FHx of any neuropathies so much less likely  Other option is to complete a panel of blood work to ensure no diabetes or B12 deficiency or other things that could make nerves more susceptible. Since likely nerve entrapments, could perform a limited blood serum panel.  Orders Placed This Encounter  Procedures   TSH   B12 and Folate Panel   Methylmalonic acid, serum   Hemoglobin A1c   CBC with Differential/Platelets   Comprehensive metabolic panel   Ambulatory referral to Occupational Therapy   Ambulatory referral to Physical Therapy     Cc: Ralene Cork, DO,  Panosh, Neta Mends, MD  Naomie Dean, MD  [facility] [address] [place], [place] [address]  Phone [phone] Fax [phone]  I spent 70 minutes of face-to-face and non-face-to-face time with patient on the  1. Ulnar neuropathy of both upper extremities   2. Numbness in both hands   3. Numbness of right foot   4. Peroneal neuropathy at knee, right    diagnosis.  This  included previsit chart review, lab review, study review, order entry, electronic health record documentation,  patient education on the different diagnostic and therapeutic options, counseling and coordination of care, risks and benefits of management, compliance, or risk factor reduction

## 2021-12-03 ENCOUNTER — Telehealth: Payer: Self-pay | Admitting: Neurology

## 2021-12-03 NOTE — Telephone Encounter (Signed)
Referral sent to Lala Lund PT for [phone]

## 2021-12-04 LAB — COMPREHENSIVE METABOLIC PANEL
ALT: 13 IU/L (ref 0–32)
AST: 12 IU/L (ref 0–40)
Albumin/Globulin Ratio: 2 (ref 1.2–2.2)
Albumin: 4.5 g/dL (ref 3.9–4.9)
Alkaline Phosphatase: 62 IU/L (ref 44–121)
BUN/Creatinine Ratio: 16 (ref 9–23)
BUN: 14 mg/dL (ref 6–24)
Bilirubin Total: 0.3 mg/dL (ref 0.0–1.2)
CO2: 24 mmol/L (ref 20–29)
Calcium: 9.6 mg/dL (ref 8.7–10.2)
Chloride: 102 mmol/L (ref 96–106)
Creatinine, Ser: 0.86 mg/dL (ref 0.57–1.00)
Globulin, Total: 2.3 g/dL (ref 1.5–4.5)
Glucose: 81 mg/dL (ref 70–99)
Potassium: 5.2 mmol/L (ref 3.5–5.2)
Sodium: 140 mmol/L (ref 134–144)
Total Protein: 6.8 g/dL (ref 6.0–8.5)
eGFR: 84 mL/min/{1.73_m2} (ref >59)

## 2021-12-04 LAB — CBC WITH DIFFERENTIAL/PLATELET
Basophils Absolute: 0 10*3/uL (ref 0.0–0.2)
Basos: 1 %
EOS (ABSOLUTE): 0.1 10*3/uL (ref 0.0–0.4)
Eos: 1 %
Hematocrit: 43.6 % (ref 34.0–46.6)
Hemoglobin: 14.2 g/dL (ref 11.1–15.9)
Immature Grans (Abs): 0 10*3/uL (ref 0.0–0.1)
Immature Granulocytes: 0 %
Lymphocytes Absolute: 1.5 10*3/uL (ref 0.7–3.1)
Lymphs: 28 %
MCH: 31.1 pg (ref 26.6–33.0)
MCHC: 32.6 g/dL (ref 31.5–35.7)
MCV: 96 fL (ref 79–97)
Monocytes Absolute: 0.5 10*3/uL (ref 0.1–0.9)
Monocytes: 9 %
Neutrophils Absolute: 3.2 10*3/uL (ref 1.4–7.0)
Neutrophils: 61 %
Platelets: 224 10*3/uL (ref 150–450)
RBC: 4.56 x10E6/uL (ref 3.77–5.28)
RDW: 11.6 % — ABNORMAL LOW (ref 11.7–15.4)
WBC: 5.2 10*3/uL (ref 3.4–10.8)

## 2021-12-04 LAB — METHYLMALONIC ACID, SERUM: Methylmalonic Acid: 236 nmol/L (ref 0–378)

## 2021-12-04 LAB — TSH: TSH: 2.31 u[IU]/mL (ref 0.450–4.500)

## 2021-12-04 LAB — B12 AND FOLATE PANEL
Folate: 17.9 ng/mL (ref >3.0)
Vitamin B-12: 247 pg/mL (ref 232–1245)

## 2021-12-04 LAB — HEMOGLOBIN A1C
Est. average glucose Bld gHb Est-mCnc: 111 mg/dL
Hgb A1c MFr Bld: 5.5 % (ref 4.8–5.6)
